# Patient Record
Sex: Male | Born: 1960
Health system: Southern US, Community
[De-identification: ages and names within clinical notes are randomized; demographics above are authoritative.]

## PROBLEM LIST (undated history)

## (undated) DIAGNOSIS — M659 Synovitis and tenosynovitis, unspecified: Secondary | ICD-10-CM

## (undated) DIAGNOSIS — J302 Other seasonal allergic rhinitis: Secondary | ICD-10-CM

## (undated) DIAGNOSIS — I10 Essential (primary) hypertension: Secondary | ICD-10-CM

## (undated) DIAGNOSIS — M1990 Unspecified osteoarthritis, unspecified site: Secondary | ICD-10-CM

## (undated) DIAGNOSIS — M6588 Other synovitis and tenosynovitis, other site: Secondary | ICD-10-CM

## (undated) DIAGNOSIS — I499 Cardiac arrhythmia, unspecified: Secondary | ICD-10-CM

## (undated) DIAGNOSIS — I2510 Atherosclerotic heart disease of native coronary artery without angina pectoris: Secondary | ICD-10-CM

## (undated) DIAGNOSIS — K219 Gastro-esophageal reflux disease without esophagitis: Secondary | ICD-10-CM

## (undated) DIAGNOSIS — F329 Major depressive disorder, single episode, unspecified: Secondary | ICD-10-CM

## (undated) HISTORY — DX: Essential (primary) hypertension: I10

## (undated) HISTORY — DX: Unspecified osteoarthritis, unspecified site: M19.90

## (undated) HISTORY — PX: KNEE ARTHROSCOPY: SUR90

## (undated) HISTORY — DX: Atherosclerotic heart disease of native coronary artery without angina pectoris: I25.10

---

## 2011-04-24 ENCOUNTER — Other Ambulatory Visit: Payer: Self-pay | Admitting: Orthopedic Surgery

## 2011-04-24 NOTE — H&P (Signed)
Blake Mcmillan  DOB: [birth year]  Date of Admission:  05/12/2011  Chief Complaint:  Left Knee Pain  History of Present Illness The patient is a 50 year old male who comes in today for a preoperative History and Physical. The patient is scheduled for a left total knee arthroplasty to be performed by [name]. [name], MD at [facility] on 05/12/2011. They have been treated conservatively in the past for the above stated problem and despite conservative measures, they continue to have progressive pain and severe functional limitations and dysfunction. They have failed non-operative management including home exercise, medications, and injections. It is felt that they would benefit from undergoing total joint replacement. Risks and benefits of the procedure have been discussed with the patient and they elect to proceed with surgery. There are no active contraindications to surgery such as ongoing infection or rapidly progressive neurological disease.  Allergies No Known Drug Allergies.   Medication History Aspirin EC (81MG  Tablet DR, Oral daily) Active. Protonix (40MG  Tablet DR, Oral daily) Active. Mltiviatamin Active. Mobic (7.5MG  Tablet, Oral daily) Active.  Medical History Osteoarthritis Gastroesophageal Reflux Disease Depression Past History of Pneumonia Past History of Transient Bradycardia. Episode was approx 9 years ago (lasted for about 3 months)  Past Surgical History Vasectomy Carpal Tunnel Repair. bilateral Arthroscopy of Knee. right Anal Fissure Repair Lateral Sphincterotomy Bilateral Sinus Surgery  Family History Cancer. father Chronic Obstructive Lung Disease. mother and brother Diabetes Mellitus. sister Drug / Alcohol Addiction. father and grandfather fathers side Heart Disease. father, brother and grandfather mothers side Heart disease in male family member before age 93 Hypertension. sister  Social  History Alcohol use. former drinker Children. 2 Current work status. working full time Marital status. married Living situation. live with spouse Post-Surgical Plans. Plan to go home following surgery  Review of Systems General:Not Present- Chills, Fever, Night Sweats, Fatigue, Weight Gain, Weight Loss and Memory Loss. Skin:Not Present- Hives, Itching, Rash, Eczema and Lesions. HEENT:Not Present- Tinnitus, Headache, Double Vision, Visual Loss, Hearing Loss and Dentures. Respiratory:Not Present- Shortness of breath with exertion, Shortness of breath at rest, Allergies, Coughing up blood and Chronic Cough. Cardiovascular:Not Present- Chest Pain, Racing/skipping heartbeats, Difficulty Breathing Lying Down, Murmur, Swelling and Palpitations. Gastrointestinal:Not Present- Bloody Stool, Heartburn, Abdominal Pain, Vomiting, Nausea, Constipation, Diarrhea, Difficulty Swallowing, Jaundice and Loss of appetitie. Male Genitourinary:Not Present- Urinary frequency, Blood in Urine, Weak urinary stream, Discharge, Flank Pain, Incontinence, Painful Urination, Urgency, Urinary Retention and Urinating at Night. Musculoskeletal:Not Present- Muscle Weakness, Muscle Pain, Joint Swelling, Joint Pain, Back Pain, Morning Stiffness and Spasms. Neurological:Not Present- Tremor, Dizziness, Blackout spells, Paralysis, Difficulty with balance and Weakness. Psychiatric:Not Present- Insomnia.  Vitals 04/24/2011 4:05 PM Weight: 275 lb Height: 70 in Body Surface Area: 2.48 m Body Mass Index: 39.46 kg/m Pulse: 64 (Regular) Resp.: 14 (Unlabored) BP: 142/88 (Sitting, Right Arm, Standard)  Physical Exam The physical exam findings are as follows:  Note: Patient is a 50 year old male seen for continued left knee pain. He is accompanied by his wife druing the visit (wife is an Char[employer] fundraiser).  General Mental Status - Alert, cooperative and good historian. General Appearance- pleasant. Not in  acute distress. Orientation- Oriented X3. Build & Nutrition- Muscular, Overweight, Well nourished and Well developed.  Head and Neck Head- normocephalic, atraumatic . Neck Global Assessment- supple. no bruit auscultated on the right and no bruit auscultated on the left.  Eye Pupil- Bilateral- Regular and Round. Motion- Bilateral- EOMI.  Chest and Lung Exam Inspection:Shape- Barrel-shaped. Auscultation:  Breath sounds:- Normal, - clear at anterior chest wall and - clear at posterior chest wall. Adventitious sounds:- No Adventitious sounds.  Cardiovascular Auscultation:Rhythm- Regular rate and rhythm. Heart Sounds- S1 WNL and S2 WNL. Murmurs & Other Heart Sounds:Auscultation of the heart reveals - No Murmurs.  Abdomen Palpation/Percussion:Tenderness- Abdomen is non-tender to palpation. Rigidity (guarding)- Abdomen is soft. Note: slighty round and protuberant Auscultation:Auscultation of the abdomen reveals - Bowel sounds normal.  Male Genitourinary Note: Not done, not pertinent to present illness  Musculoskeletal Note: On examination, well-developed male, alert and oriented, in no apparent distress. Examination of his hips show normal range of motion, no discomfort. Examination of the left knee shows valgus deformity of about five degrees. He has range of motion five to 130 degrees. There is marked crepitus on range of motion. He is tender lateral greater than medial. There is no instability noted. Examination of the right knee, no effusion. Range of motion is zero to 125 degrees. Slight crepitus on range of motion with tenderness medial greater than lateral. There is no instability noted. Pulses, sensation and motor are intact in both lower extremities.  RADIOGRAPHS: AP bilateral knees and lateral show that the patient has advanced bone-on-bone change in the lateral and patellofemoral compartment of the left knee. The right knee shows some slight narrowing  but nowhere near bone-on-bone.  Assessment & Plan Osteoarthritis Left Knee  Plan is for a Left Total Knee Replacement by Dr. Ollen Gross. Plan is to go home following the hospital stay.  [name], PA-C

## 2011-05-08 ENCOUNTER — Encounter (HOSPITAL_COMMUNITY): Payer: Self-pay | Admitting: Pharmacy Technician

## 2011-05-09 ENCOUNTER — Encounter (HOSPITAL_COMMUNITY): Payer: Self-pay

## 2011-05-09 ENCOUNTER — Encounter (HOSPITAL_COMMUNITY)
Admission: RE | Admit: 2011-05-09 | Discharge: 2011-05-09 | Disposition: A | Discharge status: Home / Self Care | Payer: Managed Care, Other (non HMO) | Source: Ambulatory Visit | Attending: Orthopedic Surgery | Admitting: Orthopedic Surgery

## 2011-05-09 DIAGNOSIS — J302 Other seasonal allergic rhinitis: Secondary | ICD-10-CM

## 2011-05-09 DIAGNOSIS — F32A Depression, unspecified: Secondary | ICD-10-CM

## 2011-05-09 DIAGNOSIS — K219 Gastro-esophageal reflux disease without esophagitis: Secondary | ICD-10-CM

## 2011-05-09 HISTORY — DX: Gastro-esophageal reflux disease without esophagitis: K21.9

## 2011-05-09 HISTORY — PX: NASAL SINUS SURGERY: SHX719

## 2011-05-09 HISTORY — DX: Other seasonal allergic rhinitis: J30.2

## 2011-05-09 HISTORY — PX: SPHINCTEROTOMY: SHX5279

## 2011-05-09 HISTORY — DX: Depression, unspecified: F32.A

## 2011-05-09 HISTORY — DX: Cardiac arrhythmia, unspecified: I49.9

## 2011-05-09 HISTORY — PX: VASECTOMY: SHX75

## 2011-05-09 HISTORY — PX: HEMORRHOID SURGERY: SHX153

## 2011-05-09 HISTORY — DX: Unspecified osteoarthritis, unspecified site: M19.90

## 2011-05-09 HISTORY — PX: CARPAL TUNNEL RELEASE: SHX101

## 2011-05-09 HISTORY — DX: Major depressive disorder, single episode, unspecified: F32.9

## 2011-05-09 LAB — SURGICAL PCR SCREEN
MRSA, PCR: NEGATIVE
Staphylococcus aureus: NEGATIVE

## 2011-05-09 LAB — URINALYSIS, ROUTINE W REFLEX MICROSCOPIC
Glucose, UA: NEGATIVE mg/dL
Leukocytes, UA: NEGATIVE
Protein, ur: NEGATIVE mg/dL
Specific Gravity, Urine: 1.025 (ref 1.005–1.030)
pH: 5.5 (ref 5.0–8.0)

## 2011-05-09 LAB — CBC
MCH: 31.1 pg (ref 26.0–34.0)
MCHC: 35.4 g/dL (ref 30.0–36.0)
Platelets: 282 10*3/uL (ref 150–400)
RDW: 13.1 % (ref 11.5–15.5)

## 2011-05-09 LAB — ABO/RH: ABO/RH(D): A NEG

## 2011-05-09 LAB — COMPREHENSIVE METABOLIC PANEL
ALT: 104 U/L — ABNORMAL HIGH (ref 0–53)
AST: 78 U/L — ABNORMAL HIGH (ref 0–37)
Albumin: 3.8 g/dL (ref 3.5–5.2)
Calcium: 9.7 mg/dL (ref 8.4–10.5)
GFR calc Af Amer: >90 mL/min (ref >90)
Sodium: 138 mEq/L (ref 135–145)
Total Protein: 8.6 g/dL — ABNORMAL HIGH (ref 6.0–8.3)

## 2011-05-09 LAB — APTT: aPTT: 37 seconds (ref 24–37)

## 2011-05-09 MED ORDER — CHLORHEXIDINE GLUCONATE 4 % EX LIQD: 60.0000 mL | Freq: Once | CUTANEOUS | Status: DC

## 2011-05-09 NOTE — Patient Instructions (Signed)
20 Blake Mcmillan  05/09/2011   Your procedure is scheduled on: 05-12-11  Report to [facility] at  0900 AM.  Call this number if you have problems the morning of surgery: [phone]   Remember:   Do not eat food:After Midnight.  May have clear liquids:until Midnight .  Clear liquids include soda, tea, black coffee, apple or grape juice, broth.  Take these medicines the morning of surgery with A SIP OF WATER: Protonix    Do not wear jewelry, make-up or nail polish.  Do not wear lotions, powders, or perfumes. You may wear deodorant.  Do not shave 48 hours prior to surgery.  Do not bring valuables to the hospital.  Contacts, dentures or bridgework may not be worn into surgery.  Leave suitcase in the car. After surgery it may be brought to your room.  For patients admitted to the hospital, checkout time is 11:00 AM the day of discharge.   Patients discharged the day of surgery will not be allowed to drive home.  Name and phone number of your driver: Thomas Hoff, spouse  Special Instructions: CHG Shower Use Special Wash: 1/2 bottle night before surgery and 1/2 bottle morning of surgery.   Please read over the following fact sheets that you were given: Blood Transfusion Information and MRSA Information, Incentive Spirometry instructions

## 2011-05-12 ENCOUNTER — Encounter (HOSPITAL_COMMUNITY): Payer: Self-pay | Admitting: *Deleted

## 2011-05-12 ENCOUNTER — Encounter (HOSPITAL_COMMUNITY): Payer: Self-pay | Admitting: Anesthesiology

## 2011-05-12 ENCOUNTER — Inpatient Hospital Stay (HOSPITAL_COMMUNITY)
Admission: RE | Admit: 2011-05-12 | Discharge: 2011-05-15 | DRG: 470 | Disposition: A | Discharge status: Home Health | Payer: Managed Care, Other (non HMO) | Source: Ambulatory Visit | Attending: Orthopedic Surgery | Admitting: Orthopedic Surgery

## 2011-05-12 ENCOUNTER — Inpatient Hospital Stay (HOSPITAL_COMMUNITY): Payer: Managed Care, Other (non HMO) | Admitting: Anesthesiology

## 2011-05-12 ENCOUNTER — Encounter (HOSPITAL_COMMUNITY)
Admission: RE | Disposition: A | Discharge status: Home Health | Payer: Self-pay | Source: Ambulatory Visit | Attending: Orthopedic Surgery

## 2011-05-12 ENCOUNTER — Other Ambulatory Visit: Payer: Self-pay

## 2011-05-12 ENCOUNTER — Encounter (HOSPITAL_COMMUNITY): Payer: Self-pay | Admitting: Orthopedic Surgery

## 2011-05-12 DIAGNOSIS — Z01812 Encounter for preprocedural laboratory examination: Secondary | ICD-10-CM

## 2011-05-12 DIAGNOSIS — E871 Hypo-osmolality and hyponatremia: Secondary | ICD-10-CM | POA: Diagnosis not present

## 2011-05-12 DIAGNOSIS — K219 Gastro-esophageal reflux disease without esophagitis: Secondary | ICD-10-CM | POA: Diagnosis present

## 2011-05-12 DIAGNOSIS — M1710 Unilateral primary osteoarthritis, unspecified knee: Principal | ICD-10-CM | POA: Diagnosis present

## 2011-05-12 DIAGNOSIS — Z96659 Presence of unspecified artificial knee joint: Secondary | ICD-10-CM

## 2011-05-12 DIAGNOSIS — Z8614 Personal history of Methicillin resistant Staphylococcus aureus infection: Secondary | ICD-10-CM

## 2011-05-12 HISTORY — PX: TOTAL KNEE ARTHROPLASTY: SHX125

## 2011-05-12 LAB — TYPE AND SCREEN: ABO/RH(D): A NEG

## 2011-05-12 SURGERY — ARTHROPLASTY, KNEE, TOTAL
Anesthesia: General | Site: Knee | Laterality: Left | Wound class: Clean

## 2011-05-12 MED ORDER — MENTHOL 3 MG MT LOZG: 1.0000 | LOZENGE | OROMUCOSAL | Status: DC | PRN

## 2011-05-12 MED ORDER — ACETAMINOPHEN 325 MG PO TABS: 650.0000 mg | ORAL_TABLET | Freq: Four times a day (QID) | ORAL | Status: DC | PRN

## 2011-05-12 MED ORDER — FENTANYL CITRATE 0.05 MG/ML IJ SOLN
INTRAMUSCULAR | Status: DC | PRN
  Administered 2011-05-12 (×4): 50 ug via INTRAVENOUS

## 2011-05-12 MED ORDER — ONDANSETRON HCL 4 MG PO TABS: 4.0000 mg | ORAL_TABLET | Freq: Four times a day (QID) | ORAL | Status: DC | PRN

## 2011-05-12 MED ORDER — CEFAZOLIN SODIUM-DEXTROSE 2-3 GM-% IV SOLR
2.0000 g | Freq: Once | INTRAVENOUS | Status: AC
  Administered 2011-05-12: 2 g via INTRAVENOUS

## 2011-05-12 MED ORDER — LACTATED RINGERS IV SOLN
INTRAVENOUS | Status: DC | PRN
  Administered 2011-05-12 (×2): via INTRAVENOUS

## 2011-05-12 MED ORDER — NEOSTIGMINE METHYLSULFATE 1 MG/ML IJ SOLN
INTRAMUSCULAR | Status: DC | PRN
  Administered 2011-05-12: 3 mg via INTRAVENOUS

## 2011-05-12 MED ORDER — PROMETHAZINE HCL 25 MG/ML IJ SOLN: 6.2500 mg | INTRAMUSCULAR | Status: DC | PRN

## 2011-05-12 MED ORDER — ACETAMINOPHEN 10 MG/ML IV SOLN
INTRAVENOUS | Status: DC | PRN
  Administered 2011-05-12: 1000 mg via INTRAVENOUS

## 2011-05-12 MED ORDER — HYDROMORPHONE HCL PF 1 MG/ML IJ SOLN
0.2500 mg | INTRAMUSCULAR | Status: DC | PRN
Start: 2011-05-12 — End: 2011-05-12
  Administered 2011-05-12 (×2): 0.5 mg via INTRAVENOUS

## 2011-05-12 MED ORDER — TEMAZEPAM 15 MG PO CAPS: 15.0000 mg | ORAL_CAPSULE | Freq: Every evening | ORAL | Status: DC | PRN

## 2011-05-12 MED ORDER — SALINE SPRAY 0.65 % NA SOLN
2.0000 | Freq: Two times a day (BID) | NASAL | Status: DC
  Administered 2011-05-12 – 2011-05-14 (×5): 2 via NASAL
  Filled 2011-05-12: qty 44

## 2011-05-12 MED ORDER — ROCURONIUM BROMIDE 100 MG/10ML IV SOLN
INTRAVENOUS | Status: DC | PRN
  Administered 2011-05-12: 20 mg via INTRAVENOUS
  Administered 2011-05-12: 5 mg via INTRAVENOUS

## 2011-05-12 MED ORDER — SUCCINYLCHOLINE CHLORIDE 20 MG/ML IJ SOLN
INTRAMUSCULAR | Status: DC | PRN
  Administered 2011-05-12: 100 mg via INTRAVENOUS

## 2011-05-12 MED ORDER — DIPHENHYDRAMINE HCL 12.5 MG/5ML PO ELIX
12.5000 mg | ORAL_SOLUTION | Freq: Four times a day (QID) | ORAL | Status: DC | PRN
  Filled 2011-05-12: qty 5

## 2011-05-12 MED ORDER — GLYCOPYRROLATE 0.2 MG/ML IJ SOLN
INTRAMUSCULAR | Status: DC | PRN
  Administered 2011-05-12: .4 mg via INTRAVENOUS

## 2011-05-12 MED ORDER — MORPHINE SULFATE (PF) 1 MG/ML IV SOLN
INTRAVENOUS | Status: DC
  Administered 2011-05-12: 5 mg via INTRAVENOUS
  Administered 2011-05-12: 14 mg via INTRAVENOUS
  Administered 2011-05-12: 1 mg via INTRAVENOUS
  Administered 2011-05-12: 8 mg via INTRAVENOUS
  Administered 2011-05-12: 14:00:00 via INTRAVENOUS
  Administered 2011-05-13: 5 mg via INTRAVENOUS
  Administered 2011-05-13: 6 mg via INTRAVENOUS
  Filled 2011-05-12 (×2): qty 25

## 2011-05-12 MED ORDER — PROPOFOL 10 MG/ML IV BOLUS
INTRAVENOUS | Status: DC | PRN
  Administered 2011-05-12: 180 mg via INTRAVENOUS

## 2011-05-12 MED ORDER — PHENOL 1.4 % MT LIQD: 1.0000 | OROMUCOSAL | Status: DC | PRN

## 2011-05-12 MED ORDER — METHOCARBAMOL 500 MG PO TABS
500.0000 mg | ORAL_TABLET | Freq: Four times a day (QID) | ORAL | Status: DC | PRN
  Administered 2011-05-13 – 2011-05-14 (×5): 500 mg via ORAL
  Filled 2011-05-12 (×5): qty 1

## 2011-05-12 MED ORDER — DIPHENHYDRAMINE HCL 12.5 MG/5ML PO ELIX
12.5000 mg | ORAL_SOLUTION | ORAL | Status: DC | PRN
  Filled 2011-05-12: qty 10

## 2011-05-12 MED ORDER — ONDANSETRON HCL 4 MG/2ML IJ SOLN
4.0000 mg | Freq: Four times a day (QID) | INTRAMUSCULAR | Status: DC | PRN
  Administered 2011-05-12 – 2011-05-13 (×2): 4 mg via INTRAVENOUS

## 2011-05-12 MED ORDER — LIDOCAINE HCL (CARDIAC) 20 MG/ML IV SOLN
INTRAVENOUS | Status: DC | PRN
  Administered 2011-05-12: 100 mg via INTRAVENOUS

## 2011-05-12 MED ORDER — DIPHENHYDRAMINE HCL 50 MG/ML IJ SOLN: 12.5000 mg | Freq: Four times a day (QID) | INTRAMUSCULAR | Status: DC | PRN

## 2011-05-12 MED ORDER — SODIUM CHLORIDE 0.9 % IR SOLN
Status: DC | PRN
  Administered 2011-05-12: 1000 mL

## 2011-05-12 MED ORDER — SODIUM CHLORIDE 0.9 % IJ SOLN: 9.0000 mL | INTRAMUSCULAR | Status: DC | PRN

## 2011-05-12 MED ORDER — MIDAZOLAM HCL 5 MG/5ML IJ SOLN
INTRAMUSCULAR | Status: DC | PRN
  Administered 2011-05-12: 2 mg via INTRAVENOUS

## 2011-05-12 MED ORDER — NALOXONE HCL 0.4 MG/ML IJ SOLN: 0.4000 mg | INTRAMUSCULAR | Status: DC | PRN

## 2011-05-12 MED ORDER — BUPIVACAINE 0.25 % ON-Q PUMP SINGLE CATH 300ML
300.0000 mL | INJECTION | Status: DC
  Administered 2011-05-12: 300 mL
  Filled 2011-05-12: qty 300

## 2011-05-12 MED ORDER — ONDANSETRON HCL 4 MG/2ML IJ SOLN
INTRAMUSCULAR | Status: DC | PRN
  Administered 2011-05-12: 4 mg via INTRAVENOUS

## 2011-05-12 MED ORDER — ONDANSETRON HCL 4 MG/2ML IJ SOLN
4.0000 mg | Freq: Four times a day (QID) | INTRAMUSCULAR | Status: DC | PRN
  Filled 2011-05-12 (×2): qty 2

## 2011-05-12 MED ORDER — METOCLOPRAMIDE HCL 5 MG/ML IJ SOLN
5.0000 mg | Freq: Three times a day (TID) | INTRAMUSCULAR | Status: DC | PRN
  Administered 2011-05-13: 10 mg via INTRAVENOUS
  Filled 2011-05-12: qty 2

## 2011-05-12 MED ORDER — RIVAROXABAN 10 MG PO TABS
10.0000 mg | ORAL_TABLET | Freq: Every day | ORAL | Status: DC
  Administered 2011-05-13 – 2011-05-15 (×3): 10 mg via ORAL
  Filled 2011-05-12 (×3): qty 1

## 2011-05-12 MED ORDER — LABETALOL HCL 5 MG/ML IV SOLN
INTRAVENOUS | Status: DC | PRN
  Administered 2011-05-12 (×2): 5 mg via INTRAVENOUS

## 2011-05-12 MED ORDER — POTASSIUM CHLORIDE IN NACL 20-0.9 MEQ/L-% IV SOLN
INTRAVENOUS | Status: DC
  Administered 2011-05-12 – 2011-05-13 (×2): via INTRAVENOUS
  Filled 2011-05-12 (×4): qty 1000

## 2011-05-12 MED ORDER — DEXTROSE 5 % IV SOLN
500.0000 mg | Freq: Four times a day (QID) | INTRAVENOUS | Status: DC | PRN
  Administered 2011-05-12: 500 mg via INTRAVENOUS
  Filled 2011-05-12: qty 5

## 2011-05-12 MED ORDER — ACETAMINOPHEN 650 MG RE SUPP: 650.0000 mg | Freq: Four times a day (QID) | RECTAL | Status: DC | PRN

## 2011-05-12 MED ORDER — BUPIVACAINE ON-Q PAIN PUMP (FOR ORDER SET NO CHG)
INJECTION | Status: DC
  Filled 2011-05-12: qty 1

## 2011-05-12 MED ORDER — CEFAZOLIN SODIUM 1-5 GM-% IV SOLN
1.0000 g | Freq: Four times a day (QID) | INTRAVENOUS | Status: AC
  Administered 2011-05-12 – 2011-05-13 (×3): 1 g via INTRAVENOUS
  Filled 2011-05-12 (×3): qty 50

## 2011-05-12 MED ORDER — OXYCODONE HCL 5 MG PO TABS
5.0000 mg | ORAL_TABLET | ORAL | Status: DC | PRN
  Administered 2011-05-13: 10 mg via ORAL
  Administered 2011-05-13: 5 mg via ORAL
  Administered 2011-05-13 – 2011-05-15 (×7): 10 mg via ORAL
  Filled 2011-05-12 (×8): qty 2
  Filled 2011-05-12: qty 1

## 2011-05-12 MED ORDER — DOCUSATE SODIUM 100 MG PO CAPS
100.0000 mg | ORAL_CAPSULE | Freq: Two times a day (BID) | ORAL | Status: DC
  Administered 2011-05-12 – 2011-05-15 (×6): 100 mg via ORAL
  Filled 2011-05-12 (×7): qty 1

## 2011-05-12 MED ORDER — METOCLOPRAMIDE HCL 10 MG PO TABS: 5.0000 mg | ORAL_TABLET | Freq: Three times a day (TID) | ORAL | Status: DC | PRN

## 2011-05-12 MED ORDER — SALINE NASAL SPRAY 0.65 % NA SOLN
2.0000 | Freq: Two times a day (BID) | NASAL | Status: DC
  Filled 2011-05-12 (×7): qty 0.9

## 2011-05-12 MED ORDER — PANTOPRAZOLE SODIUM 40 MG PO TBEC
40.0000 mg | DELAYED_RELEASE_TABLET | Freq: Every day | ORAL | Status: DC
  Administered 2011-05-12 – 2011-05-14 (×3): 40 mg via ORAL
  Filled 2011-05-12 (×4): qty 1

## 2011-05-12 MED ORDER — HYDROMORPHONE HCL PF 1 MG/ML IJ SOLN
INTRAMUSCULAR | Status: DC | PRN
  Administered 2011-05-12 (×2): 1 mg via INTRAVENOUS

## 2011-05-12 MED ORDER — LACTATED RINGERS IV SOLN
INTRAVENOUS | Status: DC
  Administered 2011-05-12: 14:00:00 via INTRAVENOUS

## 2011-05-12 SURGICAL SUPPLY — 53 items
BAG ZIPLOCK 12X15 (MISCELLANEOUS) ×2 IMPLANT
BANDAGE ELASTIC 6 VELCRO ST LF (GAUZE/BANDAGES/DRESSINGS) ×2 IMPLANT
BANDAGE ESMARK 6X9 LF (GAUZE/BANDAGES/DRESSINGS) ×1 IMPLANT
BLADE SAG 18X100X1.27 (BLADE) ×2 IMPLANT
BLADE SAW SGTL 11.0X1.19X90.0M (BLADE) ×2 IMPLANT
BNDG ESMARK 6X9 LF (GAUZE/BANDAGES/DRESSINGS) ×2
BOWL SMART MIX CTS (DISPOSABLE) ×2 IMPLANT
CATH KIT ON-Q SILVERSOAK 5IN (CATHETERS) ×2 IMPLANT
CEMENT HV SMART SET (Cement) ×2 IMPLANT
CLOTH BEACON ORANGE TIMEOUT ST (SAFETY) ×2 IMPLANT
CUFF TOURN SGL QUICK 34 (TOURNIQUET CUFF) ×1
CUFF TRNQT CYL 34X4X40X1 (TOURNIQUET CUFF) ×1 IMPLANT
DRAPE EXTREMITY T 121X128X90 (DRAPE) ×2 IMPLANT
DRAPE POUCH INSTRU U-SHP 10X18 (DRAPES) ×2 IMPLANT
DRAPE U-SHAPE 47X51 STRL (DRAPES) ×2 IMPLANT
DRSG ADAPTIC 3X8 NADH LF (GAUZE/BANDAGES/DRESSINGS) ×2 IMPLANT
DRSG PAD ABDOMINAL 8X10 ST (GAUZE/BANDAGES/DRESSINGS) ×2 IMPLANT
DURAPREP 26ML APPLICATOR (WOUND CARE) ×2 IMPLANT
ELECT REM PT RETURN 9FT ADLT (ELECTROSURGICAL) ×2
ELECTRODE REM PT RTRN 9FT ADLT (ELECTROSURGICAL) ×1 IMPLANT
EVACUATOR 1/8 PVC DRAIN (DRAIN) ×2 IMPLANT
FACESHIELD LNG OPTICON STERILE (SAFETY) ×14 IMPLANT
GAUZE SPONGE 4X4 12PLY STRL LF (GAUZE/BANDAGES/DRESSINGS) ×2 IMPLANT
GLOVE BIO SURGEON STRL SZ7.5 (GLOVE) ×2 IMPLANT
GLOVE BIO SURGEON STRL SZ8 (GLOVE) ×2 IMPLANT
GLOVE BIOGEL PI IND STRL 8 (GLOVE) ×2 IMPLANT
GLOVE BIOGEL PI INDICATOR 8 (GLOVE) ×2
GOWN STRL NON-REIN LRG LVL3 (GOWN DISPOSABLE) ×2 IMPLANT
GOWN STRL REIN XL XLG (GOWN DISPOSABLE) ×2 IMPLANT
HANDPIECE INTERPULSE COAX TIP (DISPOSABLE) ×1
IMMOBILIZER KNEE 20 (SOFTGOODS) ×2
IMMOBILIZER KNEE 20 THIGH 36 (SOFTGOODS) ×1 IMPLANT
IMMOBILIZER KNEE 22 UNIV (SOFTGOODS) ×2 IMPLANT
KIT BASIN OR (CUSTOM PROCEDURE TRAY) ×2 IMPLANT
MANIFOLD NEPTUNE II (INSTRUMENTS) ×2 IMPLANT
NS IRRIG 1000ML POUR BTL (IV SOLUTION) ×2 IMPLANT
PACK TOTAL JOINT (CUSTOM PROCEDURE TRAY) ×2 IMPLANT
PAD ABD 7.5X8 STRL (GAUZE/BANDAGES/DRESSINGS) ×2 IMPLANT
PADDING CAST COTTON 6X4 STRL (CAST SUPPLIES) ×6 IMPLANT
PADDING WEBRIL 6 STERILE (GAUZE/BANDAGES/DRESSINGS) ×2 IMPLANT
POSITIONER SURGICAL ARM (MISCELLANEOUS) ×2 IMPLANT
SET HNDPC FAN SPRY TIP SCT (DISPOSABLE) ×1 IMPLANT
SPONGE GAUZE 4X4 12PLY (GAUZE/BANDAGES/DRESSINGS) ×2 IMPLANT
STRIP CLOSURE SKIN 1/2X4 (GAUZE/BANDAGES/DRESSINGS) ×4 IMPLANT
SUCTION FRAZIER 12FR DISP (SUCTIONS) ×2 IMPLANT
SUT MNCRL AB 4-0 PS2 18 (SUTURE) ×2 IMPLANT
SUT PDS AB 1 CT1 27 (SUTURE) ×6 IMPLANT
SUT VIC AB 2-0 CT1 27 (SUTURE) ×3
SUT VIC AB 2-0 CT1 TAPERPNT 27 (SUTURE) ×3 IMPLANT
TOWEL OR 17X26 10 PK STRL BLUE (TOWEL DISPOSABLE) ×4 IMPLANT
TRAY FOLEY CATH 14FRSI W/METER (CATHETERS) ×2 IMPLANT
WATER STERILE IRR 1500ML POUR (IV SOLUTION) ×2 IMPLANT
WRAP KNEE MAXI GEL POST OP (GAUZE/BANDAGES/DRESSINGS) ×4 IMPLANT

## 2011-05-12 NOTE — Transfer of Care (Signed)
Immediate Anesthesia Transfer of Care Note  Patient: Blake Mcmillan  Procedure(s) Performed:  TOTAL KNEE ARTHROPLASTY  Patient Location: PACU  Anesthesia Type: General  Level of Consciousness: awake and alert   Airway & Oxygen Therapy: Patient Spontanous Breathing and Patient connected to face mask oxygen  Post-op Assessment: Report given to PACU RN and Post -op Vital signs reviewed and stable  Post vital signs: Reviewed and stable  Complications: No apparent anesthesia complications

## 2011-05-12 NOTE — Interval H&P Note (Signed)
History and Physical Interval Note:  05/12/2011 11:22 AM  Blake Mcmillan  has presented today for surgery, with the diagnosis of osteoarthritis left knee  The various methods of treatment have been discussed with the patient and family. After consideration of risks, benefits and other options for treatment, the patient has consented to  Procedure(s): TOTAL KNEE ARTHROPLASTY as a surgical intervention .  The patients' history has been reviewed, patient examined, no change in status, stable for surgery.  I have reviewed the patients' chart and labs.  Questions were answered to the patient's satisfaction.     Loanne Drilling

## 2011-05-12 NOTE — Anesthesia Preprocedure Evaluation (Signed)
Anesthesia Evaluation  Patient identified by MRN, date of birth, ID band Patient awake    Reviewed: Allergy & Precautions, H&P , NPO status , Patient's Chart, lab work & pertinent test results, reviewed documented beta blocker date and time   Airway Mallampati: II TM Distance: >3 FB Neck ROM: Full    Dental   Pulmonary neg pulmonary ROS,  clear to auscultation        Cardiovascular neg cardio ROS Regular Normal Denies cardiac symptoms   Neuro/Psych Negative Neurological ROS  Negative Psych ROS   GI/Hepatic negative GI ROS, Neg liver ROS,   Endo/Other  Negative Endocrine ROS  Renal/GU negative Renal ROS  Genitourinary negative   Musculoskeletal negative musculoskeletal ROS (+)   Abdominal   Peds negative pediatric ROS (+)  Hematology negative hematology ROS (+)   Anesthesia Other Findings   Reproductive/Obstetrics negative OB ROS                           Anesthesia Physical Anesthesia Plan  ASA: III  Anesthesia Plan: General   Post-op Pain Management:    Induction: Intravenous  Airway Management Planned: Oral ETT  Additional Equipment:   Intra-op Plan:   Post-operative Plan: Extubation in OR  Informed Consent: I have reviewed the patients History and Physical, chart, labs and discussed the procedure including the risks, benefits and alternatives for the proposed anesthesia with the patient or authorized representative who has indicated his/her understanding and acceptance.     Plan Discussed with: CRNA and Surgeon  Anesthesia Plan Comments:         Anesthesia Quick Evaluation

## 2011-05-12 NOTE — Anesthesia Postprocedure Evaluation (Signed)
  Anesthesia Post-op Note  Patient: Blake Mcmillan  Procedure(s) Performed:  TOTAL KNEE ARTHROPLASTY  Patient Location: PACU  Anesthesia Type: General  Level of Consciousness: oriented and sedated  Airway and Oxygen Therapy: Patient Spontanous Breathing and Patient connected to nasal cannula oxygen  Post-op Pain: mild  Post-op Assessment: Post-op Vital signs reviewed, Patient's Cardiovascular Status Stable, Respiratory Function Stable and Patent Airway  Post-op Vital Signs: stable  Complications: No apparent anesthesia complications

## 2011-05-12 NOTE — H&P (View-Only) (Signed)
Blake Mcmillan  DOB: 04/04/1961  Date of Admission:  05/12/2011  Chief Complaint:  Left Knee Pain  History of Present Illness The patient is a 50 year old male who comes in today for a preoperative History and Physical. The patient is scheduled for a left total knee arthroplasty to be performed by Dr. Frank V. [name], MD at [facility] on 05/12/2011. They have been treated conservatively in the past for the above stated problem and despite conservative measures, they continue to have progressive pain and severe functional limitations and dysfunction. They have failed non-operative management including home exercise, medications, and injections. It is felt that they would benefit from undergoing total joint replacement. Risks and benefits of the procedure have been discussed with the patient and they elect to proceed with surgery. There are no active contraindications to surgery such as ongoing infection or rapidly progressive neurological disease.  Allergies No Known Drug Allergies.   Medication History Aspirin EC (81MG Tablet DR, Oral daily) Active. Protonix (40MG Tablet DR, Oral daily) Active. Mltiviatamin Active. Mobic (7.5MG Tablet, Oral daily) Active.  Medical History Osteoarthritis Gastroesophageal Reflux Disease Depression Past History of Pneumonia Past History of Transient Bradycardia. Episode was approx 9 years ago (lasted for about 3 months)  Past Surgical History Vasectomy Carpal Tunnel Repair. bilateral Arthroscopy of Knee. right Anal Fissure Repair Lateral Sphincterotomy Bilateral Sinus Surgery  Family History Cancer. father Chronic Obstructive Lung Disease. mother and brother Diabetes Mellitus. sister Drug / Alcohol Addiction. father and grandfather fathers side Heart Disease. father, brother and grandfather mothers side Heart disease in male family member before age 55 Hypertension. sister  Social  History Alcohol use. former drinker Children. 2 Current work status. working full time Marital status. married Living situation. live with spouse Post-Surgical Plans. Plan to go home following surgery  Review of Systems General:Not Present- Chills, Fever, Night Sweats, Fatigue, Weight Gain, Weight Loss and Memory Loss. Skin:Not Present- Hives, Itching, Rash, Eczema and Lesions. HEENT:Not Present- Tinnitus, Headache, Double Vision, Visual Loss, Hearing Loss and Dentures. Respiratory:Not Present- Shortness of breath with exertion, Shortness of breath at rest, Allergies, Coughing up blood and Chronic Cough. Cardiovascular:Not Present- Chest Pain, Racing/skipping heartbeats, Difficulty Breathing Lying Down, Murmur, Swelling and Palpitations. Gastrointestinal:Not Present- Bloody Stool, Heartburn, Abdominal Pain, Vomiting, Nausea, Constipation, Diarrhea, Difficulty Swallowing, Jaundice and Loss of appetitie. Male Genitourinary:Not Present- Urinary frequency, Blood in Urine, Weak urinary stream, Discharge, Flank Pain, Incontinence, Painful Urination, Urgency, Urinary Retention and Urinating at Night. Musculoskeletal:Not Present- Muscle Weakness, Muscle Pain, Joint Swelling, Joint Pain, Back Pain, Morning Stiffness and Spasms. Neurological:Not Present- Tremor, Dizziness, Blackout spells, Paralysis, Difficulty with balance and Weakness. Psychiatric:Not Present- Insomnia.  Vitals 04/24/2011 4:05 PM Weight: 275 lb Height: 70 in Body Surface Area: 2.48 m Body Mass Index: 39.46 kg/m Pulse: 64 (Regular) Resp.: 14 (Unlabored) BP: 142/88 (Sitting, Right Arm, Standard)  Physical Exam The physical exam findings are as follows:  Note: Patient is a 50 year old male seen for continued left knee pain. He is accompanied by his wife druing the visit (wife is an RN).  General Mental Status - Alert, cooperative and good historian. General Appearance- pleasant. Not in  acute distress. Orientation- Oriented X3. Build & Nutrition- Muscular, Overweight, Well nourished and Well developed.  Head and Neck Head- normocephalic, atraumatic . Neck Global Assessment- supple. no bruit auscultated on the right and no bruit auscultated on the left.  Eye Pupil- Bilateral- Regular and Round. Motion- Bilateral- EOMI.  Chest and Lung Exam Inspection:Shape- Barrel-shaped. Auscultation:   Breath sounds:- Normal, - clear at anterior chest wall and - clear at posterior chest wall. Adventitious sounds:- No Adventitious sounds.  Cardiovascular Auscultation:Rhythm- Regular rate and rhythm. Heart Sounds- S1 WNL and S2 WNL. Murmurs & Other Heart Sounds:Auscultation of the heart reveals - No Murmurs.  Abdomen Palpation/Percussion:Tenderness- Abdomen is non-tender to palpation. Rigidity (guarding)- Abdomen is soft. Note: slighty round and protuberant Auscultation:Auscultation of the abdomen reveals - Bowel sounds normal.  Male Genitourinary Note: Not done, not pertinent to present illness  Musculoskeletal Note: On examination, well-developed male, alert and oriented, in no apparent distress. Examination of his hips show normal range of motion, no discomfort. Examination of the left knee shows valgus deformity of about five degrees. He has range of motion five to 130 degrees. There is marked crepitus on range of motion. He is tender lateral greater than medial. There is no instability noted. Examination of the right knee, no effusion. Range of motion is zero to 125 degrees. Slight crepitus on range of motion with tenderness medial greater than lateral. There is no instability noted. Pulses, sensation and motor are intact in both lower extremities.  RADIOGRAPHS: AP bilateral knees and lateral show that the patient has advanced bone-on-bone change in the lateral and patellofemoral compartment of the left knee. The right knee shows some slight narrowing  but nowhere near bone-on-bone.  Assessment & Plan Osteoarthritis Left Knee  Plan is for a Left Total Knee Replacement by Dr. Frank [name]. Plan is to go home following the hospital stay.  Drew Rajon Bisig, PA-C   

## 2011-05-12 NOTE — Op Note (Signed)
Pre-operative diagnosis- Osteoarthritis  Left knee(s)  Post-operative diagnosis- Osteoarthritis Left knee(s)  Procedure-  Left  Total Knee Arthroplasty  Surgeon- Gus Rankin. Christianne Zacher, MD  Assistant- Dimitri Ped, PA-C   Anesthesia-  General EBL-* No blood loss amount entered *  Drains Hemovac  Tourniquet time-  Total Tourniquet Time Documented: area (laterality) - 51 minutes   Complications- None  Condition-PACU - hemodynamically stable.   Brief Clinical Note   Blake Mcmillan is a 50 y.o. year old male with end stage OA of his left knee with progressively worsening pain and dysfunction. He has constant pain, with activity and at rest and significant functional deficits with difficulties even with ADLs. He has had extensive non-op management including analgesics, injections of cortisone and viscosupplements, and home exercise program, but remains in significant pain with significant dysfunction. Radiographs show bone on bone arthritis lateral and patellofemoral. He presents now for left Total Knee Arthroplasty.    Procedure in detail---   The patient is brought into the operating room and positioned supine on the operating table. After successful administration of  General,   a tourniquet is placed high on the  Left thigh(s) and the lower extremity is prepped and draped in the usual sterile fashion. Time out is performed by the operating team and then the  Left lower extremity is wrapped in Esmarch, knee flexed and the tourniquet inflated to 300 mmHg.       A midline incision is made with a ten blade through the subcutaneous tissue to the level of the extensor mechanism. A fresh blade is used to make a medial parapatellar arthrotomy. Soft tissue over the proximal medial tibia is subperiosteally elevated to the joint line with a knife and into the semimembranosus bursa with a Cobb elevator. Soft tissue over the proximal lateral tibia is elevated with attention being paid to avoiding the  patellar tendon on the tibial tubercle. The patella is everted, knee flexed 90 degrees and the ACL and PCL are removed. Findings are bone on bone lateral with large osteophytes.        The drill is used to create a starting hole in the distal femur and the canal is thoroughly irrigated with sterile saline to remove the fatty contents. The 5 degree Left  valgus alignment guide is placed into the femoral canal and the distal femoral cutting block is pinned to remove 11 mm off the distal femur. Resection is made with an oscillating saw.      The tibia is subluxed forward and the menisci are removed. The extramedullary alignment guide is placed referencing proximally at the medial aspect of the tibial tubercle and distally along the second metatarsal axis and tibial crest. The block is pinned to remove 2mm off the more deficient lateral  side. Resection is made with an oscillating saw. Size 4is the most appropriate size for the tibia and the proximal tibia is prepared with the modular drill and keel punch for that size.      The femoral sizing guide is placed and size 4 is most appropriate. Rotation is marked off the epicondylar axis and confirmed by creating a rectangular flexion gap at 90 degrees. The size 4 cutting block is pinned in this rotation and the anterior, posterior and chamfer cuts are made with the oscillating saw. The intercondylar block is then placed and that cut is made.      Trial size 4 tibial component, trial size 4 posterior stabilized femur and a 12.5  mm posterior  stabilized rotating platform insert trial is placed. Full extension is achieved with excellent varus/valgus and anterior/posterior balance throughout full range of motion. The patella is everted and thickness measured to be 27  mm. Free hand resection is taken to 15 mm, a 41 template is placed, lug holes are drilled, trial patella is placed, and it tracks normally. Osteophytes are removed off the posterior femur with the trial in  place. All trials are removed and the cut bone surfaces prepared with pulsatile lavage. Cement is mixed and once ready for implantation, the size 4 tibial implant, size  4 posterior stabilized femoral component, and the size 41 patella are cemented in place and the patella is held with the clamp. The trial insert is placed and the knee held in full extension. All extruded cement is removed and once the cement is hard the permanent 12.5 mm posterior stabilized rotating platform insert is placed into the tibial tray.      The wound is copiously irrigated with saline solution and the extensor mechanism closed over a hemovac drain with #1 PDS suture. The tourniquet is released for a total tourniquet time of 51  minutes. Flexion against gravity is 135 degrees and the patella tracks normally. Subcutaneous tissue is closed with 2.0 vicryl and subcuticular with running 4.0 Monocryl. The catheter for the Marcaine pain pump is placed and the pump is initiated. The incision is cleaned and dried and steri-strips and a bulky sterile dressing are applied. The limb is placed into a knee immobilizer and the patient is awakened and transported to recovery in stable condition.      Please note that a surgical assistant was a medical necessity for this procedure in order to perform it in a safe and expeditious manner. Surgical assistant was necessary to retract the ligaments and vital neurovascular structures to prevent injury to them and also necessary for proper positioning of the limb to allow for anatomic placement of the prosthesis.   Gus Rankin Shown Dissinger, MD    05/12/2011, 12:58 PM

## 2011-05-13 LAB — CBC
HCT: 38.7 % — ABNORMAL LOW (ref 39.0–52.0)
MCHC: 35.4 g/dL (ref 30.0–36.0)
Platelets: 235 10*3/uL (ref 150–400)
RDW: 13 % (ref 11.5–15.5)
WBC: 19.2 10*3/uL — ABNORMAL HIGH (ref 4.0–10.5)

## 2011-05-13 LAB — BASIC METABOLIC PANEL
BUN: 9 mg/dL (ref 6–23)
Chloride: 100 mEq/L (ref 96–112)
GFR calc Af Amer: >90 mL/min (ref >90)
GFR calc non Af Amer: >90 mL/min (ref >90)
Potassium: 4.2 mEq/L (ref 3.5–5.1)
Sodium: 135 mEq/L (ref 135–145)

## 2011-05-13 MED ORDER — METOPROLOL TARTRATE 25 MG PO TABS
25.0000 mg | ORAL_TABLET | Freq: Two times a day (BID) | ORAL | Status: DC
  Administered 2011-05-13 – 2011-05-15 (×4): 25 mg via ORAL
  Filled 2011-05-13 (×5): qty 1

## 2011-05-13 MED ORDER — MORPHINE SULFATE 2 MG/ML IJ SOLN
1.0000 mg | INTRAMUSCULAR | Status: DC | PRN
  Administered 2011-05-13 – 2011-05-14 (×7): 2 mg via INTRAVENOUS
  Filled 2011-05-13 (×7): qty 1

## 2011-05-13 NOTE — Progress Notes (Signed)
Utilization review completed.  

## 2011-05-13 NOTE — Progress Notes (Signed)
Physical Therapy Evaluation Patient Details Name: Blake Mcmillan MRN: [id number] DOB: 07/29/1960 Today's Date: 05/13/2011  [phone] EVII  Problem List: There is no problem list on file for this patient.   Past Medical History:  Past Medical History  Diagnosis Date  . Dysrhythmia 05-09-11    epidoses of bradycardia, stress and other test negative-Lopressor d/c.  [name] Seasonal allergies 05-09-11    seasonal allergies with sinus issues  . GERD (gastroesophageal reflux disease) 05-09-11    tx. Pantoprazole  . Arthritis 05-09-11    osteoarthritis-all joints  . Depression 05-09-11    past hx. depression, none recent  . Liver enzyme elevation 05-09-11    past hx., not now-more normal  . Abscess of buttock, right 05-09-11    lance in ERD and oral antibiotic-4 yrs ago positive MRSA, no reoccurrence   Past Surgical History:  Past Surgical History  Procedure Date  . Sphincterotomy 05-09-11    '97  . Hemorrhoid surgery 05-09-11    '96  . Vasectomy 05-09-11    '07  . Nasal sinus surgery 05-09-11    2'12 [place]  . Carpal tunnel release 05-09-11    Bilaterally '02    PT Assessment/Plan/Recommendation PT Assessment Clinical Impression Statement: Pt tolerated ambulation well during therapy session.  Pt states that he has all required equipment at home and does not need any additional equipment.  Pt would benefit from skilled acute PT and D/C to participate in HHPT to address remaining deficits.   PT Recommendation/Assessment: Patient will need skilled PT in the acute care venue PT Problem List: Decreased strength;Decreased range of motion;Decreased mobility;Decreased knowledge of use of DME;Pain Barriers to Discharge: None PT Therapy Diagnosis : Difficulty walking;Abnormality of gait;Acute pain PT Plan PT Frequency: 7X/week PT Treatment/Interventions: DME instruction;Gait training;Stair training;Functional mobility training;Therapeutic activities;Therapeutic  exercise;Patient/family education PT Recommendation Follow Up Recommendations: Home health PT Equipment Recommended: None recommended by PT PT Goals  Acute Rehab PT Goals PT Goal Formulation: With patient Time For Goal Achievement: 5 days Pt will go Supine/Side to Sit: with supervision PT Goal: Supine/Side to Sit - Progress: Progressing toward goal Pt will go Sit to Supine/Side: with supervision PT Goal: Sit to Supine/Side - Progress: Other (comment) Pt will go Sit to Stand: with supervision PT Goal: Sit to Stand - Progress: Progressing toward goal Pt will go Stand to Sit: with supervision PT Goal: Stand to Sit - Progress: Progressing toward goal Pt will Ambulate: >150 feet;with least restrictive assistive device PT Goal: Ambulate - Progress: Progressing toward goal Pt will Go Up / Down Stairs: 1-2 stairs;with supervision;with least restrictive assistive device PT Goal: Up/Down Stairs - Progress: Other (comment) Pt will Perform Home Exercise Program: Independently PT Goal: Perform Home Exercise Program - Progress: Other (comment)  PT Evaluation Precautions/Restrictions  Precautions Required Braces or Orthoses: Yes Knee Immobilizer: Discontinue once straight leg raise with < 10 degree lag Restrictions Weight Bearing Restrictions: Yes LLE Weight Bearing: Weight bearing as tolerated Prior Functioning  Home Living Lives With: Spouse Receives Help From: Family Type of Home: House Home Layout: Multi-level Home Access: Stairs to enter Entrance Stairs-Rails: None Entrance Stairs-Number of Steps: 2 Home Adaptive Equipment: Walker - rolling;Bedside commode/3-in-1;Straight cane;Shower chair without back Prior Function Level of Independence: Independent with basic ADLs;Independent with gait;Independent with transfers Driving: Yes Vocation: Full time employment Cognition Cognition Arousal/Alertness: Awake/alert Overall Cognitive Status: Appears within functional limits for tasks  assessed Orientation Level: Oriented X4 Sensation/Coordination Sensation Light Touch: Appears Intact Proprioception: Appears Intact Coordination Gross Motor Movements  are Fluid and Coordinated: Yes Extremity Assessment RUE Assessment RUE Assessment: Within Functional Limits LUE Assessment LUE Assessment: Within Functional Limits RLE Strength RLE Overall Strength Comments: grossly 3+/5 per functional assessment LLE Strength LLE Overall Strength Comments: Not tested due to surgical incision, ankle pumps WFL, quads 2+/5 Mobility (including Balance) Bed Mobility Bed Mobility: Yes Supine to Sit: 4: Min assist;HOB elevated (Comment degrees) Supine to Sit Details (indicate cue type and reason): Min A with L LE, HOB elevated approx 40 deg Transfers Transfers: Yes Sit to Stand: 4: Min assist;From bed;From elevated surface;With upper extremity assist Sit to Stand Details (indicate cue type and reason): Pt required cues for hand placement and L LE management Stand to Sit: 4: Min assist;To chair/3-in-1;With upper extremity assist;With armrests Stand to Sit Details: Required cues for hand placement and assist with L LE Ambulation/Gait Ambulation/Gait: Yes Ambulation/Gait Assistance: 4: Min assist Ambulation/Gait Assistance Details (indicate cue type and reason): Required cues for placement and sequencing of RW Ambulation Distance (Feet): 50 Feet Assistive device: Rolling walker Gait Pattern: Step-to pattern Gait velocity: decreased gait velocity Stairs: No Wheelchair Mobility Wheelchair Mobility: No    Exercise    End of Session PT - End of Session Equipment Utilized During Treatment: Left knee immobilizer Activity Tolerance: Patient tolerated treatment well Patient left: in chair;with call bell in reach Nurse Communication: Other (comment) (RN notified pts request for pain meds, O2 sats on room air ) General Behavior During Session: [facility] for tasks performed Cognition: [facility] for  tasks performed  Page, Meribeth Mattes 05/13/2011, 10:23 AM

## 2011-05-13 NOTE — Progress Notes (Signed)
Subjective: 1 Day Post-Op Procedure(s) (LRB): TOTAL KNEE ARTHROPLASTY (Left) Patient reports pain as mild.   Patient seen in rounds with Dr. Lequita Halt. Patient doing well on day one.  Had a decent night.  Able to get some rest last night We will start therapy today. Plan is to go home after hospital stay.  Objective: Vital signs in last 24 hours: Temp:  [97.6 F (36.4 C)-98.7 F (37.1 C)] 98.2 F (36.8 C) (12/18 0555) Pulse Rate:  [69-90] 90  (12/18 0555) Resp:  [10-18] 16  (12/18 0555) BP: (116-185)/(68-107) 116/68 mmHg (12/18 0555) SpO2:  [92 %-100 %] 92 % (12/18 0555) Weight:  [124.286 kg (274 lb)] 274 lb (124.286 kg) (12/17 1524)  Intake/Output from previous day:  Intake/Output Summary (Last 24 hours) at 05/13/11 0809 Last data filed at 05/13/11 0137  Gross per 24 hour  Intake 3342.8 ml  Output   1775 ml  Net 1567.8 ml    Intake/Output this shift:    Labs:  Basename 05/13/11 0409  HGB 13.7    Basename 05/13/11 0409  WBC 19.2*  RBC 4.39  HCT 38.7*  PLT 235    Basename 05/13/11 0409  NA 135  K 4.2  CL 100  CO2 26  BUN 9  CREATININE 0.74  GLUCOSE 145*  CALCIUM 8.8   No results found for this basename: LABPT:2,INR:2 in the last 72 hours  Exam - Neurovascular intact Sensation intact distally Dressing - clean, dry, no drainage Motor function intact - moving foot and toes well on exam.  Hemovac pulled without difficulty.  Assessment/Plan: 1 Day Post-Op Procedure(s) (LRB): TOTAL KNEE ARTHROPLASTY (Left)  Past Medical History  Diagnosis Date  . Dysrhythmia 05-09-11    epidoses of bradycardia, stress and other test negative-Lopressor d/c.  [name] Seasonal allergies 05-09-11    seasonal allergies with sinus issues  . GERD (gastroesophageal reflux disease) 05-09-11    tx. Pantoprazole  . Arthritis 05-09-11    osteoarthritis-all joints  . Depression 05-09-11    past hx. depression, none recent  . Liver enzyme elevation 05-09-11    past hx., not now-more  normal  . Abscess of buttock, right 05-09-11    lance in ERD and oral antibiotic-4 yrs ago positive MRSA, no reoccurrence    Advance diet Up with therapy Discharge home with home health when met goals  DVT Prophylaxis - Xarelto  Protocol Weight-Bearing as tolerated to left leg Keep foley until tomorrow. No vaccines. D/C PCA, Change to IV push D/C O2 and Pulse OX and try on Room Air  Blake Mcmillan 05/13/2011, 8:09 AM

## 2011-05-13 NOTE — Progress Notes (Addendum)
Physical Therapy Treatment Patient Details Name: Blake Mcmillan MRN: [id number] DOB: 1961/03/09 Today's Date: 05/13/2011  [id number] Marguerite Olea  PT Assessment/Plan  PT - Assessment/Plan PT Plan: Discharge plan remains appropriate PT Frequency: 7X/week Follow Up Recommendations: Home health PT Equipment Recommended: None recommended by PT PT Goals  Acute Rehab PT Goals PT Goal Formulation: With patient Time For Goal Achievement: 5 days Pt will go Sit to Supine/Side: with supervision PT Goal: Sit to Supine/Side - Progress: Progressing toward goal Pt will go Sit to Stand: with supervision PT Goal: Sit to Stand - Progress: Progressing toward goal Pt will go Stand to Sit: with supervision PT Goal: Stand to Sit - Progress: Progressing toward goal Pt will Ambulate: >150 feet;with least restrictive assistive device PT Goal: Ambulate - Progress: Progressing toward goal Pt will Perform Home Exercise Program: Independently PT Goal: Perform Home Exercise Program - Progress: Progressing toward goal  PT Treatment Precautions/Restrictions  Precautions Required Braces or Orthoses: Yes Knee Immobilizer: Discontinue once straight leg raise with < 10 degree lag Restrictions Weight Bearing Restrictions: Yes LLE Weight Bearing: Weight bearing as tolerated Mobility (including Balance) Bed Mobility Bed Mobility: Yes Sit to Supine - Left: 4: Min assist Sit to Supine - Left Details (indicate cue type and reason): Required assist with LLE, cues for hand placement and LE management/correct UE use  Transfers Transfers: Yes Sit to Stand: 4: Min assist;From chair/3-in-1;With upper extremity assist;With armrests Sit to Stand Details (indicate cue type and reason): Required cues for hand placement  Stand to Sit: 4: Min assist;With upper extremity assist;To bed Stand to Sit Details: requires LLE assist cues for technique Ambulation/Gait Ambulation/Gait: Yes Ambulation/Gait Assistance: 4: Min  assist Ambulation/Gait Assistance Details (indicate cue type and reason): requires cues for placement and sequencing of RW Ambulation Distance (Feet): 50 Feet Assistive device: Rolling walker Gait Pattern: Step-to pattern Gait velocity: decreased gait velocity Stairs: No Wheelchair Mobility Wheelchair Mobility: No       Exercise  Total Joint Exercises Ankle Circles/Pumps: AROM;Left;15 reps;Supine Quad Sets: AROM;Left;15 reps;Supine Heel Slides: AAROM;Left;15 reps;Supine Straight Leg Raises: AAROM;Left;15 reps;Supine   End of Session PT - End of Session Equipment Utilized During Treatment: Left knee immobilizer Activity Tolerance: Patient limited by pain Patient left: in bed;with call bell in reach General Behavior During Session: [facility] for tasks performed Cognition: [facility] for tasks performed  Page, Meribeth Mattes 05/13/2011, 3:10 PM

## 2011-05-14 LAB — BASIC METABOLIC PANEL
BUN: 9 mg/dL (ref 6–23)
CO2: 28 mEq/L (ref 19–32)
Chloride: 98 mEq/L (ref 96–112)
GFR calc non Af Amer: >90 mL/min (ref >90)
Glucose, Bld: 129 mg/dL — ABNORMAL HIGH (ref 70–99)
Potassium: 3.8 mEq/L (ref 3.5–5.1)
Sodium: 131 mEq/L — ABNORMAL LOW (ref 135–145)

## 2011-05-14 LAB — CBC
HCT: 36.7 % — ABNORMAL LOW (ref 39.0–52.0)
Hemoglobin: 12.7 g/dL — ABNORMAL LOW (ref 13.0–17.0)
RBC: 4.13 MIL/uL — ABNORMAL LOW (ref 4.22–5.81)
WBC: 19.9 10*3/uL — ABNORMAL HIGH (ref 4.0–10.5)

## 2011-05-14 MED ORDER — BISACODYL 10 MG RE SUPP
10.0000 mg | Freq: Every day | RECTAL | Status: DC | PRN
  Administered 2011-05-15: 10 mg via RECTAL
  Filled 2011-05-14: qty 1

## 2011-05-14 MED ORDER — FLEET ENEMA 7-19 GM/118ML RE ENEM: 1.0000 | ENEMA | Freq: Every day | RECTAL | Status: DC | PRN

## 2011-05-14 NOTE — Progress Notes (Signed)
Physical Therapy Treatment Patient Details Name: Blake Mcmillan MRN: [id number] DOB: 06-Jul-1960 Today's Date: 05/14/2011  [id number] G, TE PT Assessment/Plan  PT - Assessment/Plan Comments on Treatment Session: Pt tolerated ambulation and exercise well with minor increase in pain with activity.  Discussed practicing stairs during tomorrows session to prepare for D/C.  PT continues to recommend HHPT.   PT Plan: Discharge plan remains appropriate PT Frequency: 7X/week Follow Up Recommendations: Home health PT Equipment Recommended: None recommended by PT PT Goals  Acute Rehab PT Goals PT Goal Formulation: With patient Time For Goal Achievement: 5 days Pt will go Sit to Supine/Side: with supervision PT Goal: Sit to Supine/Side - Progress: Progressing toward goal Pt will go Sit to Stand: with supervision PT Goal: Sit to Stand - Progress: Partly met Pt will go Stand to Sit: with supervision PT Goal: Stand to Sit - Progress: Progressing toward goal Pt will Ambulate: >150 feet;with least restrictive assistive device PT Goal: Ambulate - Progress: Progressing toward goal Pt will Perform Home Exercise Program: Independently PT Goal: Perform Home Exercise Program - Progress: Progressing toward goal  PT Treatment Precautions/Restrictions  Precautions Required Braces or Orthoses: Yes Knee Immobilizer: Discontinue once straight leg raise with < 10 degree lag Restrictions Weight Bearing Restrictions: Yes LLE Weight Bearing: Weight bearing as tolerated Mobility (including Balance) Bed Mobility Bed Mobility: Yes Sit to Supine - Right: 4: Min assist;HOB elevated (comment degrees) (HOB elevated approx 30deg) Sit to Supine - Right Details (indicate cue type and reason): Requires assist with LLE, cues for hand placement on bed before sitting. Transfers Transfers: Yes Sit to Stand: 5: Supervision;With upper extremity assist;With armrests;From chair/3-in-1 Sit to Stand Details (indicate cue  type and reason): cues for UE placement and LE manangement Stand to Sit: 4: Min assist;With upper extremity assist;To bed Stand to Sit Details: requires LLE assist and cues for technique Ambulation/Gait Ambulation/Gait: Yes Ambulation/Gait Assistance: 4: Min assist Ambulation/Gait Assistance Details (indicate cue type and reason): requires cues for sequencing and technique with RW, upright posture Ambulation Distance (Feet): 100 Feet Assistive device: Rolling walker Gait Pattern: Step-to pattern Gait velocity: decreased gait velocity Stairs: No    Exercise  Total Joint Exercises Ankle Circles/Pumps: AROM;Left;10 reps;Supine Quad Sets: AROM;Left;10 reps;Supine Short Arc Quad: AAROM;Left;10 reps;Supine Heel Slides: AAROM;Left;10 reps;Supine Hip ABduction/ADduction: AAROM;Left;10 reps;Supine Straight Leg Raises: AAROM;Left;10 reps;Supine End of Session PT - End of Session Equipment Utilized During Treatment: Left knee immobilizer Activity Tolerance: Patient limited by pain Patient left: in bed;with call bell in reach General Behavior During Session: [facility] for tasks performed Cognition: [facility] for tasks performed  Page, Meribeth Mattes 05/14/2011, 2:26 PM

## 2011-05-14 NOTE — Progress Notes (Signed)
Occupational Therapy Note Spoke with patient who has all his DME and he doesn't feel he needs OT. States he has been up to the bathroom this am with assist and feel comfortable with 3in1 transfer. Wife can assist at discharge. Will defer OT eval per pt request. Judithann Sauger OTR/L [phone] 05/14/2011

## 2011-05-14 NOTE — Progress Notes (Signed)
Subjective: 2 Days Post-Op Procedure(s) (LRB): TOTAL KNEE ARTHROPLASTY (Left) Patient reports pain as mild.   Patient seen in rounds with Dr. Lequita Halt. Patient has complaints of no bowel movement yet  Objective: Vital signs in last 24 hours: Temp:  [98.2 F (36.8 C)-100 F (37.8 C)] 98.7 F (37.1 C) (12/19 0605) Pulse Rate:  [92-116] 92  (12/19 0605) Resp:  [12-20] 12  (12/19 0605) BP: (143-176)/(83-92) 144/83 mmHg (12/19 0605) SpO2:  [91 %-98 %] 92 % (12/19 0605)  Intake/Output from previous day:  Intake/Output Summary (Last 24 hours) at 05/14/11 0754 Last data filed at 05/14/11 0605  Gross per 24 hour  Intake 1653.67 ml  Output   2350 ml  Net -696.33 ml    Intake/Output this shift:    Labs:  Basename 05/14/11 0410 05/13/11 0409  HGB 12.7* 13.7    Basename 05/14/11 0410 05/13/11 0409  WBC 19.9* 19.2*  RBC 4.13* 4.39  HCT 36.7* 38.7*  PLT 231 235    Basename 05/14/11 0410 05/13/11 0409  NA 131* 135  K 3.8 4.2  CL 98 100  CO2 28 26  BUN 9 9  CREATININE 0.83 0.74  GLUCOSE 129* 145*  CALCIUM 8.6 8.8   No results found for this basename: LABPT:2,INR:2 in the last 72 hours  Exam - Neurologically intact Neurovascular intact Incision: no drainage Compartment soft Dressing/Incision - clean, dry, no drainage Motor function intact - moving foot and toes well on exam.    Assessment/Plan: 2 Days Post-Op Procedure(s) (LRB): TOTAL KNEE ARTHROPLASTY (Left)  Past Medical History  Diagnosis Date  . Dysrhythmia 05-09-11    epidoses of bradycardia, stress and other test negative-Lopressor d/c.  [name] Seasonal allergies 05-09-11    seasonal allergies with sinus issues  . GERD (gastroesophageal reflux disease) 05-09-11    tx. Pantoprazole  . Arthritis 05-09-11    osteoarthritis-all joints  . Depression 05-09-11    past hx. depression, none recent  . Liver enzyme elevation 05-09-11    past hx., not now-more normal  . Abscess of buttock, right 05-09-11    lance  in ERD and oral antibiotic-4 yrs ago positive MRSA, no reoccurrence    Advance diet Up with therapy D/C IV fluids Plan for discharge tomorrow  DVT Prophylaxis - Xarelto Protocol Weight-Bearing as tolerated to left leg  Dia Donate V 05/14/2011, 7:54 AM

## 2011-05-14 NOTE — Progress Notes (Signed)
05/14/2011 Raynelle Bring BSN CCM [phone] Tct pt's spouse at her request; left msg on voicemail CM spoke with patient and plans are for him to return to his home where spouse will be caregiver. States he already has DME including walker. Wants to use [place] for [facility] services. CM to follow for hh needs

## 2011-05-15 ENCOUNTER — Encounter (HOSPITAL_COMMUNITY): Payer: Self-pay | Admitting: Orthopedic Surgery

## 2011-05-15 DIAGNOSIS — E871 Hypo-osmolality and hyponatremia: Secondary | ICD-10-CM | POA: Diagnosis not present

## 2011-05-15 LAB — CBC
HCT: 36.2 % — ABNORMAL LOW (ref 39.0–52.0)
Hemoglobin: 12.5 g/dL — ABNORMAL LOW (ref 13.0–17.0)
MCH: 30.7 pg (ref 26.0–34.0)
MCHC: 34.5 g/dL (ref 30.0–36.0)
RBC: 4.07 MIL/uL — ABNORMAL LOW (ref 4.22–5.81)

## 2011-05-15 MED ORDER — OXYCODONE HCL 5 MG PO TABS: 5.0000 mg | ORAL_TABLET | ORAL | Status: AC | PRN

## 2011-05-15 MED ORDER — METHOCARBAMOL 500 MG PO TABS: 500.0000 mg | ORAL_TABLET | Freq: Four times a day (QID) | ORAL | Status: AC | PRN

## 2011-05-15 MED ORDER — RIVAROXABAN 10 MG PO TABS: 10.0000 mg | ORAL_TABLET | Freq: Every day | ORAL | Status: DC

## 2011-05-15 NOTE — Progress Notes (Signed)
Subjective: 3 Days Post-Op Procedure(s) (LRB): TOTAL KNEE ARTHROPLASTY (Left) Patient reports pain as mild.   Patient seen in rounds with Dr. Lequita Halt. Patient has complaints of mild pain but ready to go home today.  Objective: Vital signs in last 24 hours: Temp:  [98.4 F (36.9 C)-99.3 F (37.4 C)] 98.4 F (36.9 C) (12/20 0600) Pulse Rate:  [86-98] 88  (12/20 0600) Resp:  [12-16] 16  (12/20 0600) BP: (128-145)/(77-86) 128/77 mmHg (12/20 0600) SpO2:  [94 %-95 %] 95 % (12/20 0600)  Intake/Output from previous day:  Intake/Output Summary (Last 24 hours) at 05/15/11 0905 Last data filed at 05/15/11 0900  Gross per 24 hour  Intake 1866.33 ml  Output   3200 ml  Net -1333.67 ml    Intake/Output this shift: Total I/O In: 240 [P.O.:240] Out: -   Labs: Results for orders placed during the hospital encounter of 05/12/11  CBC      Component Value Range   WBC 19.2 (*) 4.0 - 10.5 (K/uL)   RBC 4.39  4.22 - 5.81 (MIL/uL)   Hemoglobin 13.7  13.0 - 17.0 (g/dL)   HCT 16.1 (*) 09.6 - 52.0 (%)   MCV 88.2  78.0 - 100.0 (fL)   MCH 31.2  26.0 - 34.0 (pg)   MCHC 35.4  30.0 - 36.0 (g/dL)   RDW 04.5  40.9 - 81.1 (%)   Platelets 235  150 - 400 (K/uL)  BASIC METABOLIC PANEL      Component Value Range   Sodium 135  135 - 145 (mEq/L)   Potassium 4.2  3.5 - 5.1 (mEq/L)   Chloride 100  96 - 112 (mEq/L)   CO2 26  19 - 32 (mEq/L)   Glucose, Bld 145 (*) 70 - 99 (mg/dL)   BUN 9  6 - 23 (mg/dL)   Creatinine, Ser 9.14  0.50 - 1.35 (mg/dL)   Calcium 8.8  8.4 - 78.2 (mg/dL)   GFR calc non Af Amer >90  >90 (mL/min)   GFR calc Af Amer >90  >90 (mL/min)  CBC      Component Value Range   WBC 19.9 (*) 4.0 - 10.5 (K/uL)   RBC 4.13 (*) 4.22 - 5.81 (MIL/uL)   Hemoglobin 12.7 (*) 13.0 - 17.0 (g/dL)   HCT 95.6 (*) 21.3 - 52.0 (%)   MCV 88.9  78.0 - 100.0 (fL)   MCH 30.8  26.0 - 34.0 (pg)   MCHC 34.6  30.0 - 36.0 (g/dL)   RDW 08.6  57.8 - 46.9 (%)   Platelets 231  150 - 400 (K/uL)  BASIC METABOLIC  PANEL      Component Value Range   Sodium 131 (*) 135 - 145 (mEq/L)   Potassium 3.8  3.5 - 5.1 (mEq/L)   Chloride 98  96 - 112 (mEq/L)   CO2 28  19 - 32 (mEq/L)   Glucose, Bld 129 (*) 70 - 99 (mg/dL)   BUN 9  6 - 23 (mg/dL)   Creatinine, Ser 6.29  0.50 - 1.35 (mg/dL)   Calcium 8.6  8.4 - 52.8 (mg/dL)   GFR calc non Af Amer >90  >90 (mL/min)   GFR calc Af Amer >90  >90 (mL/min)  CBC      Component Value Range   WBC 18.9 (*) 4.0 - 10.5 (K/uL)   RBC 4.07 (*) 4.22 - 5.81 (MIL/uL)   Hemoglobin 12.5 (*) 13.0 - 17.0 (g/dL)   HCT 41.3 (*) 24.4 - 52.0 (%)   MCV  88.9  78.0 - 100.0 (fL)   MCH 30.7  26.0 - 34.0 (pg)   MCHC 34.5  30.0 - 36.0 (g/dL)   RDW 57.8  46.9 - 62.9 (%)   Platelets 249  150 - 400 (K/uL)    Exam: Neurovascular intact Sensation intact distally Incision - clean, dry Motor function intact - moving foot and toes well on exam.   Assessment/Plan: 3 Days Post-Op Procedure(s) (LRB): TOTAL KNEE ARTHROPLASTY (Left) Up with therapy Discharge home with home health Procedure(s) (LRB): TOTAL KNEE ARTHROPLASTY (Left) Past Medical History  Diagnosis Date  . Dysrhythmia 05-09-11    epidoses of bradycardia, stress and other test negative-Lopressor d/c.  [name] Seasonal allergies 05-09-11    seasonal allergies with sinus issues  . GERD (gastroesophageal reflux disease) 05-09-11    tx. Pantoprazole  . Arthritis 05-09-11    osteoarthritis-all joints  . Depression 05-09-11    past hx. depression, none recent  . Liver enzyme elevation 05-09-11    past hx., not now-more normal  . Abscess of buttock, right 05-09-11    lance in ERD and oral antibiotic-4 yrs ago positive MRSA, no reoccurrence    Diet - regular Follow up - in 2 weeks Activity - WBAT Condition Upon Discharge - Good D/C Meds - Oxy IR, Robaxin, Xarelto DVT Prophylaxis - Xarelto Protocol   Jen Benedict 05/15/2011, 9:05 AM

## 2011-05-15 NOTE — Progress Notes (Signed)
Discharge summary sent to payer through [facility]  

## 2011-05-15 NOTE — Progress Notes (Signed)
05/15/2011 Axl Rodino,rn bsn ccm [id number] Pt discharged today; no further hh or dme others/has been set up with Genevieve Norlander for hhpt

## 2011-05-21 NOTE — Discharge Summary (Signed)
Physician Discharge Summary   Patient ID: Blake Mcmillan MRN: [id number] DOB/AGE: 50-Apr-1962 50 y.o.  Admit date: 05/12/2011 Discharge date: 05/15/2011  Primary Diagnosis: Osteoarthritis Left Knee  Admission Diagnoses: Past Medical History  Diagnosis Date  . Dysrhythmia 05-09-11    epidoses of bradycardia, stress and other test negative-Lopressor d/c.  [name] Seasonal allergies 05-09-11    seasonal allergies with sinus issues  . GERD (gastroesophageal reflux disease) 05-09-11    tx. Pantoprazole  . Arthritis 05-09-11    osteoarthritis-all joints  . Depression 05-09-11    past hx. depression, none recent  . Liver enzyme elevation 05-09-11    past hx., not now-more normal  . Abscess of buttock, right 05-09-11    lance in ERD and oral antibiotic-4 yrs ago positive MRSA, no reoccurrence    Discharge Diagnoses:  Active Problems:  Postop Hyponatremia   Procedure: Procedure(s) (LRB): TOTAL KNEE ARTHROPLASTY (Left)   Consults: none  HPI: Blake Mcmillan is a 50 y.o. year old male with end stage OA of his left knee with progressively worsening pain and dysfunction. He has constant pain, with activity and at rest and significant functional deficits with difficulties even with ADLs. He has had extensive non-op management including analgesics, injections of cortisone and viscosupplements, and home exercise program, but remains in significant pain with significant dysfunction. Radiographs show bone on bone arthritis lateral and patellofemoral. He presents now for left Total Knee Arthroplasty.   Laboratory Data: Hospital Outpatient Visit on 05/09/2011  Component Date Value Range Status  . MRSA, PCR  05/09/2011 NEGATIVE  NEGATIVE Final  . Staphylococcus aureus  05/09/2011 NEGATIVE  NEGATIVE Final   Comment:                                 The Xpert SA Assay (FDA                          approved for NASAL specimens                          only), is one component of                a comprehensive surveillance                          program.  It is not intended                          to diagnose infection nor to                          guide or monitor treatment.  [name] aPTT (seconds) 05/09/2011 37  24-37 Final   Comment:                                 IF BASELINE aPTT IS ELEVATED,                          SUGGEST PATIENT RISK ASSESSMENT                          BE USED TO DETERMINE APPROPRIATE  ANTICOAGULANT THERAPY.  . WBC (K/uL) 05/09/2011 12.2* 4.0-10.5 Final  . RBC (MIL/uL) 05/09/2011 5.33  4.22-5.81 Final  . Hemoglobin (g/dL) 16/02/9603 54.0  98.1-19.1 Final  . HCT (%) 05/09/2011 46.9  39.0-52.0 Final  . MCV (fL) 05/09/2011 88.0  78.0-100.0 Final  . MCH (pg) 05/09/2011 31.1  26.0-34.0 Final  . MCHC (g/dL) 47/82/9562 13.0  86.5-78.4 Final  . RDW (%) 05/09/2011 13.1  11.5-15.5 Final  . Platelets (K/uL) 05/09/2011 282  150-400 Final  . Sodium (mEq/L) 05/09/2011 138  135-145 Final  . Potassium (mEq/L) 05/09/2011 4.0  3.5-5.1 Final  . Chloride (mEq/L) 05/09/2011 102  96-112 Final  . CO2 (mEq/L) 05/09/2011 27  19-32 Final  . Glucose, Bld (mg/dL) 69/62/9528 413* 24-40 Final  . BUN (mg/dL) 03/22/2535 11  6-44 Final  . Creatinine, Ser (mg/dL) 03/47/4259 5.63  8.75-6.43 Final  . Calcium (mg/dL) 32/95/1884 9.7  1.6-60.6 Final  . Total Protein (g/dL) 30/16/0109 8.6* 3.2-3.5 Final  . Albumin (g/dL) 57/32/2025 3.8  4.2-7.0 Final  . AST (U/L) 05/09/2011 78* 0-37 Final  . ALT (U/L) 05/09/2011 104* 0-53 Final  . Alkaline Phosphatase (U/L) 05/09/2011 75  39-117 Final  . Total Bilirubin (mg/dL) 62/37/6283 0.4  1.5-1.7 Final  . GFR calc non Af Amer (mL/min) 05/09/2011 >90  >90 Final  . GFR calc Af Amer (mL/min) 05/09/2011 >90  >90 Final   Comment:                                 The eGFR has been calculated                          using the CKD EPI equation.                          This calculation has not been                           validated in all clinical                          situations.                          eGFR's persistently                          <90 mL/min signify                          possible Chronic Kidney Disease.  [name] Prothrombin Time (seconds) 05/09/2011 13.9  11.6-15.2 Final  . INR  05/09/2011 1.05  0.00-1.49 Final  . Color, Urine  05/09/2011 YELLOW  YELLOW Final  . APPearance  05/09/2011 CLEAR  CLEAR Final  . Specific Gravity, Urine  05/09/2011 1.025  1.005-1.030 Final  . pH  05/09/2011 5.5  5.0-8.0 Final  . Glucose, UA (mg/dL) 61/60/7371 NEGATIVE  NEGATIVE Final  . Hgb urine dipstick  05/09/2011 NEGATIVE  NEGATIVE Final  . Bilirubin Urine  05/09/2011 NEGATIVE  NEGATIVE Final  . Ketones, ur (mg/dL) 11/19/9483 NEGATIVE  NEGATIVE Final  . Protein, ur (mg/dL) 46/27/0350 NEGATIVE  NEGATIVE Final  . Urobilinogen, UA (mg/dL) 09/38/1829 0.2  9.3-7.1 Final  . Nitrite  05/09/2011 NEGATIVE  NEGATIVE Final  . Leukocytes, UA  05/09/2011 NEGATIVE  NEGATIVE Final   MICROSCOPIC NOT DONE ON URINES WITH NEGATIVE PROTEIN, BLOOD, LEUKOCYTES, NITRITE, OR GLUCOSE <1000 mg/dL.  . ABO/RH(D)  05/09/2011 A NEG   Final  . Antibody Screen  05/09/2011 NEG   Final  . Sample Expiration  05/09/2011 05/15/2011   Final  . ABO/RH(D)  05/09/2011 A NEG   Final   No results found for this basename: HGB:5 in the last 72 hours No results found for this basename: WBC:2,RBC:2,HCT:2,PLT:2 in the last 72 hours No results found for this basename: NA:2,K:2,CL:2,CO2:2,BUN:2,CREATININE:2,GLUCOSE:2,CALCIUM:2 in the last 72 hours No results found for this basename: LABPT:2,INR:2 in the last 72 hours  X-Rays:No results found.  EKG: Orders placed in visit on 05/12/11  . EKG 12-LEAD     Hospital Course: Patient was admitted to [facility] and taken to the OR and underwent the above state procedure without complications.  Patient tolerated the procedure well and was later transferred to the recovery room and then to  the orthopaedic floor for postoperative care.  They were given PO and IV analgesics for pain control following their surgery.  They were given 24 hours of postoperative antibiotics and started on DVT prophylaxis.   PT and OT were ordered for total joint protocol.  Discharge planning consulted to help with postop disposition and equipment needs.  Patient had a decent night on the evening of surgery and able to get some rest and started to get up with therapy on day one and walked 50 feet.  PCA was discontinued and they were weaned over to PO meds.  Hemovac drain was pulled without difficulty.  Continued to progress with therapy into day two.  Dressing was changed on day two and the incision was looking good.  By day three, the patient had progressed with therapy and meeting goals.  Incision was healing well.  Patient was seen in rounds and was ready to go home.  Discharge Medications: Prior to Admission medications   Medication Sig Start Date End Date Taking? Authorizing Provider  pantoprazole (PROTONIX) 40 MG tablet Take 40 mg by mouth daily.     Yes Historical Provider, MD  methocarbamol (ROBAXIN) 500 MG tablet Take 1 tablet (500 mg total) by mouth every 6 (six) hours as needed. 05/15/11 05/25/11  Alexzandrew Perkins, PA  oxyCODONE (OXY IR/ROXICODONE) 5 MG immediate release tablet Take 1-2 tablets (5-10 mg total) by mouth every 4 (four) hours as needed for pain. 05/15/11 05/25/11  Alexzandrew Julien Girt, PA  rivaroxaban (XARELTO) 10 MG TABS tablet Take 1 tablet (10 mg total) by mouth daily with breakfast. 05/15/11   Alexzandrew Julien Girt, PA  sodium chloride (OCEAN) 0.65 % nasal spray Place 2 sprays into the nose 2 (two) times daily.      Historical Provider, MD    Diet: regular  Activity:WBAT   Follow-up:in 2 weeks  Disposition: home  Discharged Condition: good   Discharge Orders    Future Orders Please Complete By Expires   Diet - low sodium heart healthy      Call MD / Call 911       Comments:   If you experience chest pain or shortness of breath, CALL 911 and be transported to the hospital emergency room.  If you develope a fever above 101 F, pus (white drainage) or increased drainage or redness at the wound, or calf pain, call your surgeon's office.   Constipation Prevention      Comments:  Drink plenty of fluids.  Prune juice may be helpful.  You may use a stool softener, such as Colace (over the counter) 100 mg twice a day.  Use MiraLax (over the counter) for constipation as needed.   Increase activity slowly as tolerated      Weight Bearing as taught in Physical Therapy      Comments:   Use a walker or crutches as instructed.   Discharge instructions      Comments:   Pick up stool softner and laxative for home. Do not submerge incision under water. May shower. Continue to use ice for pain and swelling from surgery.    Driving restrictions      Comments:   No driving   Lifting restrictions      Comments:   No lifting   TED hose      Comments:   Use stockings (TED hose) for 3 weeks on both leg(s).  You may remove them at night for sleeping.   Change dressing      Comments:   Change dressing daily with sterile 4 x 4 inch gauze dressing and apply TED hose.   Do not put a pillow under the knee. Place it under the heel.        Discharge Medication List as of 05/15/2011  9:19 AM    START taking these medications   Details  methocarbamol (ROBAXIN) 500 MG tablet Take 1 tablet (500 mg total) by mouth every 6 (six) hours as needed., Starting 05/15/2011, Until Sun 05/25/11, Print    oxyCODONE (OXY IR/ROXICODONE) 5 MG immediate release tablet Take 1-2 tablets (5-10 mg total) by mouth every 4 (four) hours as needed for pain., Starting 05/15/2011, Until Sun 05/25/11, Print    rivaroxaban (XARELTO) 10 MG TABS tablet Take 1 tablet (10 mg total) by mouth daily with breakfast., Starting 05/15/2011, Until Discontinued, Print      CONTINUE these medications which have  NOT CHANGED   Details  pantoprazole (PROTONIX) 40 MG tablet Take 40 mg by mouth daily.  , Until Discontinued, Historical Med    sodium chloride (OCEAN) 0.65 % nasal spray Place 2 sprays into the nose 2 (two) times daily.  , Until Discontinued, Historical Med      STOP taking these medications     aspirin EC 81 MG tablet      meloxicam (MOBIC) 7.5 MG tablet      Multiple Vitamins-Minerals (MULTIVITAMINS THER. W/MINERALS) TABS        Follow-up Information    Follow up with [name],FRANK V. Make an appointment in 2 weeks.   Contact information:   [facility] [address] [place] [address] [phone]          Signed: Patrica Duel 05/21/2011, 2:39 PM

## 2013-12-29 ENCOUNTER — Other Ambulatory Visit: Payer: Self-pay | Admitting: Orthopedic Surgery

## 2013-12-29 NOTE — Progress Notes (Signed)
Preoperative surgical orders have been place into the Epic hospital system for Thyra BreedWilliam J Rabe on 12/29/2013, 9:59 AM  by Patrica DuelPERKINS, Khyre Germond for surgery on 02/01/2014.  Preop Knee Scope orders including IV Tylenol and IV Decadron as long as there are no contraindications to the above medications. Avel Peacerew Dorthula Bier, PA-C .

## 2014-01-25 ENCOUNTER — Encounter (HOSPITAL_COMMUNITY): Payer: Self-pay

## 2014-01-25 NOTE — Patient Instructions (Addendum)
Ebony Rickel Caster  01/25/2014                           YOUR PROCEDURE IS SCHEDULED ON: 02/01/14               ENTER THRU [facility] MAIN HOSPITAL ENTRANCE AND                            FOLLOW  SIGNS TO SHORT STAY CENTER                 ARRIVE AT SHORT STAY AT:  8:15 AM               CALL THIS NUMBER IF ANY PROBLEMS THE DAY OF SURGERY :               [phone]                                REMEMBER:   Do not eat food or drink liquids AFTER MIDNIGHT                  Take these medicines the morning of surgery with               A SIPS OF WATER :  PROTONIX / MAY USE NASAL SPRAY      Do not wear jewelry, make-up   Do not wear lotions, powders, or perfumes.   Do not shave legs or underarms 12 hrs. before surgery (men may shave face)  Do not bring valuables to the hospital.  Contacts, dentures or bridgework may not be worn into surgery.  Leave suitcase in the car. After surgery it may be brought to your room.  For patients admitted to the hospital more than one night, checkout time is            11:00 AM                                                       The day of discharge.   Patients discharged the day of surgery will not be allowed to drive home.            If going home same day of surgery, must have someone stay with you              FIRST 24 hrs at home and arrange for some one to drive you              home from hospital.   ________________________________________________________________________                                                                        [facility] - PREPARING FOR SURGERY  Before surgery, you can play an important role.  Because skin is not sterile, your skin needs to be as free of germs as possible.  You can reduce the number of germs  on your skin by washing with CHG (chlorahexidine gluconate) soap before surgery.  CHG is an antiseptic cleaner which kills germs and bonds with the skin to continue killing germs even after  washing. Please DO NOT use if you have an allergy to CHG or antibacterial soaps.  If your skin becomes reddened/irritated stop using the CHG and inform your nurse when you arrive at Short Stay. Do not shave (including legs and underarms) for at least 48 hours prior to the first CHG shower.  You may shave your face. Please follow these instructions carefully:   1.  Shower with CHG Soap the night before surgery and the  morning of Surgery.   2.  If you choose to wash your hair, wash your hair first as usual with your  normal  Shampoo.   3.  After you shampoo, rinse your hair and body thoroughly to remove the  shampoo.                                         4.  Use CHG as you would any other liquid soap.  You can apply chg directly  to the skin and wash . Gently wash with scrungie or clean wascloth    5.  Apply the CHG Soap to your body ONLY FROM THE NECK DOWN.   Do not use on open                           Wound or open sores. Avoid contact with eyes, ears mouth and genitals (private parts).                        Genitals (private parts) with your normal soap.              6.  Wash thoroughly, paying special attention to the area where your surgery  will be performed.   7.  Thoroughly rinse your body with warm water from the neck down.   8.  DO NOT shower/wash with your normal soap after using and rinsing off  the CHG Soap .                9.  Pat yourself dry with a clean towel.             10.  Wear clean pajamas.             11.  Place clean sheets on your bed the night of your first shower and do not  sleep with pets.  Day of Surgery : Do not apply any lotions/deodorants the morning of surgery.  Please wear clean clothes to the hospital/surgery center.  FAILURE TO FOLLOW THESE INSTRUCTIONS MAY RESULT IN THE CANCELLATION OF YOUR SURGERY    PATIENT  SIGNATURE_________________________________  ______________________________________________________________________     Rogelia Mire  An incentive spirometer is a tool that can help keep your lungs clear and active. This tool measures how well you are filling your lungs with each breath. Taking long deep breaths may help reverse or decrease the chance of developing breathing (pulmonary) problems (especially infection) following:  A long period of time when you are unable to move or be active. BEFORE THE PROCEDURE   If the spirometer includes an indicator to show your best effort, your nurse or respiratory therapist will set it to  a desired goal.  If possible, sit up straight or lean slightly forward. Try not to slouch.  Hold the incentive spirometer in an upright position. INSTRUCTIONS FOR USE  1. Sit on the edge of your bed if possible, or sit up as far as you can in bed or on a chair. 2. Hold the incentive spirometer in an upright position. 3. Breathe out normally. 4. Place the mouthpiece in your mouth and seal your lips tightly around it. 5. Breathe in slowly and as deeply as possible, raising the piston or the ball toward the top of the column. 6. Hold your breath for 3-5 seconds or for as long as possible. Allow the piston or ball to fall to the bottom of the column. 7. Remove the mouthpiece from your mouth and breathe out normally. 8. Rest for a few seconds and repeat Steps 1 through 7 at least 10 times every 1-2 hours when you are awake. Take your time and take a few normal breaths between deep breaths. 9. The spirometer may include an indicator to show your best effort. Use the indicator as a goal to work toward during each repetition. 10. After each set of 10 deep breaths, practice coughing to be sure your lungs are clear. If you have an incision (the cut made at the time of surgery), support your incision when coughing by placing a pillow or rolled up towels firmly  against it. Once you are able to get out of bed, walk around indoors and cough well. You may stop using the incentive spirometer when instructed by your caregiver.  RISKS AND COMPLICATIONS  Take your time so you do not get dizzy or light-headed.  If you are in pain, you may need to take or ask for pain medication before doing incentive spirometry. It is harder to take a deep breath if you are having pain. AFTER USE  Rest and breathe slowly and easily.  It can be helpful to keep track of a log of your progress. Your caregiver can provide you with a simple table to help with this. If you are using the spirometer at home, follow these instructions: [place] IF:   You are having difficultly using the spirometer.  You have trouble using the spirometer as often as instructed.  Your pain medication is not giving enough relief while using the spirometer.  You develop fever of 100.5 F (38.1 C) or higher. SEEK IMMEDIATE MEDICAL CARE IF:   You cough up bloody sputum that had not been present before.  You develop fever of 102 F (38.9 C) or greater.  You develop worsening pain at or near the incision site. MAKE SURE YOU:   Understand these instructions.  Will watch your condition.  Will get help right away if you are not doing well or get worse. Document Released: 09/22/2006 Document Revised: 08/04/2011 Document Reviewed: 11/23/2006 [facility] Patient Information 2014 [place], [place].   ________________________________________________________________________

## 2014-01-27 ENCOUNTER — Encounter (HOSPITAL_COMMUNITY): Payer: Self-pay | Admitting: Pharmacy Technician

## 2014-01-27 ENCOUNTER — Encounter (HOSPITAL_COMMUNITY)
Admission: RE | Admit: 2014-01-27 | Discharge: 2014-01-27 | Disposition: A | Discharge status: Home / Self Care | Payer: BC Managed Care – PPO | Source: Ambulatory Visit | Attending: Orthopedic Surgery | Admitting: Orthopedic Surgery

## 2014-01-27 ENCOUNTER — Ambulatory Visit (HOSPITAL_COMMUNITY)
Admission: RE | Admit: 2014-01-27 | Discharge: 2014-01-27 | Disposition: A | Discharge status: Home / Self Care | Payer: BC Managed Care – PPO | Source: Ambulatory Visit | Attending: Anesthesiology | Admitting: Anesthesiology

## 2014-01-27 ENCOUNTER — Encounter (HOSPITAL_COMMUNITY): Payer: Self-pay

## 2014-01-27 DIAGNOSIS — I10 Essential (primary) hypertension: Secondary | ICD-10-CM | POA: Insufficient documentation

## 2014-01-27 DIAGNOSIS — Z87891 Personal history of nicotine dependence: Secondary | ICD-10-CM | POA: Diagnosis not present

## 2014-01-27 DIAGNOSIS — Z01818 Encounter for other preprocedural examination: Secondary | ICD-10-CM | POA: Insufficient documentation

## 2014-01-27 HISTORY — DX: Synovitis and tenosynovitis, unspecified: M65.9

## 2014-01-27 HISTORY — DX: Other synovitis and tenosynovitis, other site: M65.88

## 2014-01-27 HISTORY — DX: Essential (primary) hypertension: I10

## 2014-01-27 LAB — BASIC METABOLIC PANEL
Anion gap: 13 (ref 5–15)
BUN: 13 mg/dL (ref 6–23)
CHLORIDE: 97 meq/L (ref 96–112)
CO2: 27 meq/L (ref 19–32)
CREATININE: 0.92 mg/dL (ref 0.50–1.35)
Calcium: 10 mg/dL (ref 8.4–10.5)
GFR calc Af Amer: >90 mL/min (ref >90)
GFR calc non Af Amer: >90 mL/min (ref >90)
Glucose, Bld: 103 mg/dL — ABNORMAL HIGH (ref 70–99)
Potassium: 4.7 mEq/L (ref 3.7–5.3)
Sodium: 137 mEq/L (ref 137–147)

## 2014-01-27 LAB — CBC
HEMATOCRIT: 46.5 % (ref 39.0–52.0)
Hemoglobin: 16.5 g/dL (ref 13.0–17.0)
MCH: 31.8 pg (ref 26.0–34.0)
MCHC: 35.5 g/dL (ref 30.0–36.0)
MCV: 89.6 fL (ref 78.0–100.0)
Platelets: 276 10*3/uL (ref 150–400)
RBC: 5.19 MIL/uL (ref 4.22–5.81)
RDW: 12.9 % (ref 11.5–15.5)
WBC: 11.5 10*3/uL — ABNORMAL HIGH (ref 4.0–10.5)

## 2014-01-27 NOTE — Progress Notes (Signed)
01/27/14 1409  OBSTRUCTIVE SLEEP APNEA  Have you ever been diagnosed with sleep apnea through a sleep study? No  Do you snore loudly (loud enough to be heard through closed doors)?  1  Do you often feel tired, fatigued, or sleepy during the daytime? 1  Has anyone observed you stop breathing during your sleep? 1  Do you have, or are you being treated for high blood pressure? 1  BMI more than 35 kg/m2? 1  Age over 53 years old? 1  Neck circumference greater than 40 cm/16 inches? 1  Gender: 1  Obstructive Sleep Apnea Score 8  Score 4 or greater  Results sent to PCP

## 2014-01-31 MED ORDER — DEXTROSE 5 % IV SOLN
3.0000 g | INTRAVENOUS | Status: AC
  Administered 2014-02-01: 3 g via INTRAVENOUS
  Filled 2014-01-31: qty 3000

## 2014-02-01 ENCOUNTER — Ambulatory Visit (HOSPITAL_COMMUNITY): Payer: BC Managed Care – PPO | Admitting: *Deleted

## 2014-02-01 ENCOUNTER — Encounter (HOSPITAL_COMMUNITY): Payer: Self-pay | Admitting: *Deleted

## 2014-02-01 ENCOUNTER — Encounter (HOSPITAL_COMMUNITY)
Admission: RE | Disposition: A | Discharge status: Home / Self Care | Payer: Self-pay | Source: Ambulatory Visit | Attending: Orthopedic Surgery

## 2014-02-01 ENCOUNTER — Encounter (HOSPITAL_COMMUNITY): Payer: BC Managed Care – PPO | Admitting: *Deleted

## 2014-02-01 ENCOUNTER — Ambulatory Visit (HOSPITAL_COMMUNITY)
Admission: RE | Admit: 2014-02-01 | Discharge: 2014-02-01 | Disposition: A | Discharge status: Home / Self Care | Payer: BC Managed Care – PPO | Source: Ambulatory Visit | Attending: Orthopedic Surgery | Admitting: Orthopedic Surgery

## 2014-02-01 DIAGNOSIS — Z87891 Personal history of nicotine dependence: Secondary | ICD-10-CM | POA: Diagnosis not present

## 2014-02-01 DIAGNOSIS — R29898 Other symptoms and signs involving the musculoskeletal system: Secondary | ICD-10-CM | POA: Insufficient documentation

## 2014-02-01 DIAGNOSIS — M6580 Other synovitis and tenosynovitis, unspecified site: Secondary | ICD-10-CM | POA: Diagnosis present

## 2014-02-01 DIAGNOSIS — K219 Gastro-esophageal reflux disease without esophagitis: Secondary | ICD-10-CM | POA: Diagnosis not present

## 2014-02-01 DIAGNOSIS — Z79899 Other long term (current) drug therapy: Secondary | ICD-10-CM | POA: Diagnosis not present

## 2014-02-01 DIAGNOSIS — Z96659 Presence of unspecified artificial knee joint: Secondary | ICD-10-CM | POA: Insufficient documentation

## 2014-02-01 DIAGNOSIS — I10 Essential (primary) hypertension: Secondary | ICD-10-CM | POA: Diagnosis not present

## 2014-02-01 DIAGNOSIS — Z7982 Long term (current) use of aspirin: Secondary | ICD-10-CM | POA: Diagnosis not present

## 2014-02-01 DIAGNOSIS — M25862 Other specified joint disorders, left knee: Secondary | ICD-10-CM

## 2014-02-01 HISTORY — PX: KNEE ARTHROSCOPY: SHX127

## 2014-02-01 SURGERY — ARTHROSCOPY, KNEE
Anesthesia: General | Site: Knee | Laterality: Left

## 2014-02-01 MED ORDER — PROPOFOL 10 MG/ML IV BOLUS
INTRAVENOUS | Status: AC
  Filled 2014-02-01: qty 20

## 2014-02-01 MED ORDER — LIDOCAINE HCL (CARDIAC) 20 MG/ML IV SOLN
INTRAVENOUS | Status: AC
  Filled 2014-02-01: qty 5

## 2014-02-01 MED ORDER — LACTATED RINGERS IV SOLN
INTRAVENOUS | Status: DC | PRN
  Administered 2014-02-01: 10:00:00 via INTRAVENOUS

## 2014-02-01 MED ORDER — PROPOFOL 10 MG/ML IV BOLUS
INTRAVENOUS | Status: DC | PRN
  Administered 2014-02-01: 230 mg via INTRAVENOUS

## 2014-02-01 MED ORDER — PROMETHAZINE HCL 25 MG/ML IJ SOLN: 6.2500 mg | INTRAMUSCULAR | Status: DC | PRN

## 2014-02-01 MED ORDER — BUPIVACAINE-EPINEPHRINE (PF) 0.25% -1:200000 IJ SOLN
INTRAMUSCULAR | Status: AC
  Filled 2014-02-01: qty 30

## 2014-02-01 MED ORDER — ONDANSETRON HCL 4 MG/2ML IJ SOLN
INTRAMUSCULAR | Status: DC | PRN
  Administered 2014-02-01: 4 mg via INTRAVENOUS

## 2014-02-01 MED ORDER — LACTATED RINGERS IV SOLN
INTRAVENOUS | Status: DC
  Administered 2014-02-01: 1000 mL via INTRAVENOUS

## 2014-02-01 MED ORDER — LIDOCAINE HCL 1 % IJ SOLN
INTRAMUSCULAR | Status: DC | PRN
  Administered 2014-02-01: 50 mg via INTRADERMAL

## 2014-02-01 MED ORDER — FENTANYL CITRATE 0.05 MG/ML IJ SOLN
INTRAMUSCULAR | Status: DC | PRN
  Administered 2014-02-01: 25 ug via INTRAVENOUS
  Administered 2014-02-01: 100 ug via INTRAVENOUS
  Administered 2014-02-01: 25 ug via INTRAVENOUS
  Administered 2014-02-01 (×2): 50 ug via INTRAVENOUS

## 2014-02-01 MED ORDER — FENTANYL CITRATE 0.05 MG/ML IJ SOLN: 25.0000 ug | INTRAMUSCULAR | Status: DC | PRN

## 2014-02-01 MED ORDER — ACETAMINOPHEN 10 MG/ML IV SOLN
1000.0000 mg | Freq: Once | INTRAVENOUS | Status: AC
  Administered 2014-02-01: 1000 mg via INTRAVENOUS
  Filled 2014-02-01: qty 100

## 2014-02-01 MED ORDER — SODIUM CHLORIDE 0.9 % IV SOLN: INTRAVENOUS | Status: DC

## 2014-02-01 MED ORDER — HYDROCODONE-ACETAMINOPHEN 5-325 MG PO TABS
1.0000 | ORAL_TABLET | Freq: Four times a day (QID) | ORAL | Status: DC | PRN
Start: 2014-02-01 — End: 2016-08-21

## 2014-02-01 MED ORDER — ONDANSETRON HCL 4 MG/2ML IJ SOLN
INTRAMUSCULAR | Status: AC
  Filled 2014-02-01: qty 2

## 2014-02-01 MED ORDER — FENTANYL CITRATE 0.05 MG/ML IJ SOLN
INTRAMUSCULAR | Status: AC
  Filled 2014-02-01: qty 5

## 2014-02-01 MED ORDER — CHLORHEXIDINE GLUCONATE 4 % EX LIQD: 60.0000 mL | Freq: Once | CUTANEOUS | Status: DC

## 2014-02-01 MED ORDER — METHOCARBAMOL 500 MG PO TABS: 500.0000 mg | ORAL_TABLET | Freq: Four times a day (QID) | ORAL | Status: DC

## 2014-02-01 MED ORDER — DEXAMETHASONE SODIUM PHOSPHATE 10 MG/ML IJ SOLN
10.0000 mg | Freq: Once | INTRAMUSCULAR | Status: AC
  Administered 2014-02-01: 10 mg via INTRAVENOUS

## 2014-02-01 MED ORDER — LACTATED RINGERS IV SOLN: INTRAVENOUS | Status: DC

## 2014-02-01 MED ORDER — MIDAZOLAM HCL 2 MG/2ML IJ SOLN
INTRAMUSCULAR | Status: AC
  Filled 2014-02-01: qty 2

## 2014-02-01 MED ORDER — SODIUM CHLORIDE 0.9 % IR SOLN
Status: DC | PRN
  Administered 2014-02-01 (×3): 3000 mL

## 2014-02-01 MED ORDER — MEPERIDINE HCL 50 MG/ML IJ SOLN: 6.2500 mg | INTRAMUSCULAR | Status: DC | PRN

## 2014-02-01 MED ORDER — METHOCARBAMOL 1000 MG/10ML IJ SOLN
500.0000 mg | Freq: Once | INTRAVENOUS | Status: AC
  Administered 2014-02-01: 500 mg via INTRAVENOUS
  Filled 2014-02-01: qty 5

## 2014-02-01 MED ORDER — BUPIVACAINE-EPINEPHRINE 0.25% -1:200000 IJ SOLN
INTRAMUSCULAR | Status: DC | PRN
  Administered 2014-02-01: 20 mL

## 2014-02-01 MED ORDER — MIDAZOLAM HCL 5 MG/5ML IJ SOLN
INTRAMUSCULAR | Status: DC | PRN
  Administered 2014-02-01: 2 mg via INTRAVENOUS

## 2014-02-01 SURGICAL SUPPLY — 26 items
BANDAGE ELASTIC 6 VELCRO ST LF (GAUZE/BANDAGES/DRESSINGS) ×3 IMPLANT
BLADE 4.2CUDA (BLADE) ×3 IMPLANT
CLOTH BEACON ORANGE TIMEOUT ST (SAFETY) ×3 IMPLANT
COUNTER NEEDLE 20 DBL MAG RED (NEEDLE) ×3 IMPLANT
CUFF TOURN SGL QUICK 34 (TOURNIQUET CUFF) ×2
CUFF TRNQT CYL 34X4X40X1 (TOURNIQUET CUFF) ×1 IMPLANT
DRAPE U-SHAPE 47X51 STRL (DRAPES) ×3 IMPLANT
DRSG EMULSION OIL 3X3 NADH (GAUZE/BANDAGES/DRESSINGS) ×3 IMPLANT
DURAPREP 26ML APPLICATOR (WOUND CARE) ×3 IMPLANT
GAUZE SPONGE 4X4 12PLY STRL (GAUZE/BANDAGES/DRESSINGS) ×3 IMPLANT
GLOVE BIO SURGEON STRL SZ8 (GLOVE) ×3 IMPLANT
GLOVE BIOGEL PI IND STRL 8 (GLOVE) ×1 IMPLANT
GLOVE BIOGEL PI INDICATOR 8 (GLOVE) ×2
GOWN STRL REUS W/TWL LRG LVL3 (GOWN DISPOSABLE) ×3 IMPLANT
MANIFOLD NEPTUNE II (INSTRUMENTS) ×3 IMPLANT
PACK ARTHROSCOPY WL (CUSTOM PROCEDURE TRAY) ×3 IMPLANT
PACK ICE MAXI GEL EZY WRAP (MISCELLANEOUS) ×9 IMPLANT
PAD ABD 8X10 STRL (GAUZE/BANDAGES/DRESSINGS) ×3 IMPLANT
PADDING CAST COTTON 6X4 STRL (CAST SUPPLIES) ×3 IMPLANT
POSITIONER SURGICAL ARM (MISCELLANEOUS) ×3 IMPLANT
SET ARTHROSCOPY TUBING (MISCELLANEOUS) ×2
SET ARTHROSCOPY TUBING LN (MISCELLANEOUS) ×1 IMPLANT
SUT ETHILON 4 0 PS 2 18 (SUTURE) ×3 IMPLANT
TOWEL OR 17X26 10 PK STRL BLUE (TOWEL DISPOSABLE) ×3 IMPLANT
WAND 90 DEG TURBOVAC W/CORD (SURGICAL WAND) ×3 IMPLANT
WRAP KNEE MAXI GEL POST OP (GAUZE/BANDAGES/DRESSINGS) ×3 IMPLANT

## 2014-02-01 NOTE — Interval H&P Note (Signed)
History and Physical Interval Note:  02/01/2014 10:01 AM  Blake Mcmillan  has presented today for surgery, with the diagnosis of LEFT KNEE HYPERTROPHIC SYNOVITIS   The various methods of treatment have been discussed with the patient and family. After consideration of risks, benefits and other options for treatment, the patient has consented to  Procedure(s): LEFT KNEE ARTHROSCOPY WITH SYNOVECTOMY  (Left) as a surgical intervention .  The patient's history has been reviewed, patient examined, no change in status, stable for surgery.  I have reviewed the patient's chart and labs.  Questions were answered to the patient's satisfaction.     Loanne Drilling

## 2014-02-01 NOTE — Anesthesia Postprocedure Evaluation (Signed)
  Anesthesia Post-op Note  Patient: Blake Mcmillan  Procedure(s) Performed: Procedure(s) (LRB): LEFT KNEE ARTHROSCOPY WITH SYNOVECTOMY  (Left)  Patient Location: PACU  Anesthesia Type: General  Level of Consciousness: awake and alert   Airway and Oxygen Therapy: Patient Spontanous Breathing  Post-op Pain: mild  Post-op Assessment: Post-op Vital signs reviewed, Patient's Cardiovascular Status Stable, Respiratory Function Stable, Patent Airway and No signs of Nausea or vomiting  Last Vitals:  Filed Vitals:   02/01/14 1227  BP: 125/79  Pulse: 77  Temp:   Resp: 16    Post-op Vital Signs: stable   Complications: No apparent anesthesia complications

## 2014-02-01 NOTE — Brief Op Note (Signed)
02/01/2014  10:57 AM  PATIENT:  Thyra Breed  53 y.o. male  PRE-OPERATIVE DIAGNOSIS:  LEFT KNEE HYPERTROPHIC SYNOVITIS   POST-OPERATIVE DIAGNOSIS:  LEFT KNEE HYPERTROPHIC SYNOVITIS   PROCEDURE:  Procedure(s): LEFT KNEE ARTHROSCOPY WITH SYNOVECTOMY  (Left)  SURGEON:  Surgeon(s) and Role:    * Loanne Drilling, MD - Primary  PHYSICIAN ASSISTANT:   ASSISTANTS: none   ANESTHESIA:   general  EBL:     BLOOD ADMINISTERED:none  DRAINS: none   LOCAL MEDICATIONS USED:  MARCAINE     COUNTS:  YES  TOURNIQUET:    DICTATION: .Other Dictation: Dictation Number [id number]  PLAN OF CARE: Discharge to home after PACU  PATIENT DISPOSITION:  PACU - hemodynamically stable.

## 2014-02-01 NOTE — H&P (Signed)
  CC- Blake Mcmillan is a 53 y.o. male who presents with left knee pain.  HPI- . Knee Pain: Patient presents with knee pain involving the  left knee. Onset of the symptoms was several months ago. Inciting event: none known. Current symptoms include crepitus sensation, foreign body sensation and popping sensation. Pain is aggravated by going up and down stairs and rising after sitting.  Patient had a total kne arthroplasty in 12/12 and developed painful opping consistent with patellar clunk syndrome in the past year. prior knee problems. Evaluation to date: plain films: normal.   Past Medical History  Diagnosis Date  . Seasonal allergies 05-09-11    seasonal allergies with sinus issues  . GERD (gastroesophageal reflux disease) 05-09-11    tx. Pantoprazole  . Arthritis 05-09-11    osteoarthritis-all joints  . Depression 05-09-11    past hx. depression, none recent  . Dysrhythmia 05-09-11    epidoses of bradycardia, stress and other test negative-Lopressor d/c.  [name] Hypertension   . Hypertrophic synovitis     L KNEE    Past Surgical History  Procedure Laterality Date  . Sphincterotomy  05-09-11    '97  . Hemorrhoid surgery  05-09-11    '96  . Vasectomy  05-09-11    '07  . Nasal sinus surgery  05-09-11    2'12 [place]  . Carpal tunnel release  05-09-11    Bilaterally '02  . Total knee arthroplasty  05/12/2011    Procedure: TOTAL KNEE ARTHROPLASTY;  Surgeon: Loanne Drilling;  Location: [facility]S;  Service: Orthopedics;  Laterality: Left;  . Knee arthroscopy      RT KNEE    Prior to Admission medications   Medication Sig Start Date End Date Taking? Authorizing Provider  acetaminophen (TYLENOL) 500 MG tablet Take 1,500 mg by mouth every morning.    Historical Provider, MD  aspirin EC 81 MG tablet Take 81 mg by mouth every morning.    Historical Provider, MD  benazepril (LOTENSIN) 40 MG tablet Take 40 mg by mouth every morning.    Historical Provider, MD  Multiple Vitamin  (MULTIVITAMIN WITH MINERALS) TABS tablet Take 1 tablet by mouth every morning.    Historical Provider, MD  pantoprazole (PROTONIX) 40 MG tablet Take 40 mg by mouth every morning.     Historical Provider, MD  sodium chloride (OCEAN) 0.65 % nasal spray Place 2 sprays into the nose 2 (two) times daily.      Historical Provider, MD   KNEE EXAM No effusion, collateral ligaments intact, crepitus on range of motion, range 0-122  Physical Examination: General appearance - alert, well appearing, and in no distress Mental status - alert, oriented to person, place, and time Chest - clear to auscultation, no wheezes, rales or rhonchi, symmetric air entry Heart - normal rate, regular rhythm, normal S1, S2, no murmurs, rubs, clicks or gallops Abdomen - soft, nontender, nondistended, no masses or organomegaly Neurological - alert, oriented, normal speech, no focal findings or movement disorder noted   Asessment/Plan--- Left knee patellar clunk syndrome- - Plan left knee arthroscopy with synovectomy. Procedure risks and potential comps discussed with patient who elects to proceed. Goals are decreased pain and increased function with a high likelihood of achieving both

## 2014-02-01 NOTE — Discharge Instructions (Signed)
Arthroscopic Procedure, Knee °An arthroscopic procedure can find what is wrong with your knee. °PROCEDURE °Arthroscopy is a surgical technique that allows your orthopedic surgeon to diagnose and treat your knee injury with accuracy. They will look into your knee through a small instrument. This is almost like a small (pencil sized) telescope. Because arthroscopy affects your knee less than open knee surgery, you can anticipate a more rapid recovery. Taking an active role by following your caregiver's instructions will help with rapid and complete recovery. Use crutches, rest, elevation, ice, and knee exercises as instructed. The length of recovery depends on various factors including type of injury, age, physical condition, medical conditions, and your rehabilitation. °Your knee is the joint between the large bones (femur and tibia) in your leg. Cartilage covers these bone ends which are smooth and slippery and allow your knee to bend and move smoothly. Two menisci, thick, semi-lunar shaped pads of cartilage which form a rim inside the joint, help absorb shock and stabilize your knee. Ligaments bind the bones together and support your knee joint. Muscles move the joint, help support your knee, and take stress off the joint itself. Because of this all programs and physical therapy to rehabilitate an injured or repaired knee require rebuilding and strengthening your muscles. °AFTER THE PROCEDURE °· After the procedure, you will be moved to a recovery area until most of the effects of the medication have worn off. Your caregiver will discuss the test results with you.  °· Only take over-the-counter or prescription medicines for pain, discomfort, or fever as directed by your caregiver.  °SEEK MEDICAL CARE IF:  °· You have increased bleeding from your wounds.  °· You see redness, swelling, or have increasing pain in your wounds.  °· You have pus coming from your wound.  °· You have an oral temperature above 102° F (38.9°  C).  °· You notice a bad smell coming from the wound or dressing.  °· You have severe pain with any motion of your knee.  °SEEK IMMEDIATE MEDICAL CARE IF:  °· You develop a rash.  °· You have difficulty breathing.  °· You have any allergic problems.  °FURTHER INSTRUCTIONS: °· You may start showering two days after being discharged home but do not submerge the incisions under water.  °· Change dressing 48 hours after the procedure and then cover the small incisions with band aids until your follow up visit. °· Avoid periods of inactivity such as sitting longer than an hour when not asleep. This helps prevent blood clots.  °· You may put full weight on your legs and walk as much as is comfortable.  °· Do not drive while taking narcotics.  °Wear the elastic stockings for three weeks following surgery during the day but you may remove then at night. °· Make sure you keep all of your appointments after your operation with all of your doctors and caregivers. You should call the office at [phone] and make an appointment for approximately one week after the date of your surgery. °· Please pick up a stool softener and laxative for home use as long as you are requiring pain medications. °· Continue to use ice on the knee for pain and swelling from surgery. You may notice swelling that will progress down to the foot and ankle.  This is normal after surgery.  Elevate the leg when you are not up walking on it.   °RANGE OF MOTION AND STRENGTHENING EXERCISES  °Rehabilitation of the knee is   important following a knee injury or an operation. After just a few days of immobilization, the muscles of the thigh which control the knee become weakened and shrink (atrophy). Knee exercises are designed to build up the tone and strength of the thigh muscles and to improve knee motion. Often times heat used for twenty to thirty minutes before working out will loosen up your tissues and help with improving the range of motion but do not  use heat for the first two weeks following surgery. These exercises can be done on a training (exercise) mat, on the floor, on a table or on a bed. Use what ever works the best and is most comfortable for you Knee exercises include: ° ° ° ° ° ° °QUAD STRENGTHENING EXERCISES °Strengthening Quadriceps Sets ° °Tighten muscles on top of thigh by pushing knees down into floor or table. °Hold for 20 seconds. Repeat 10 times. °Do 2 sessions per day. ° ° ° °Strengthening Terminal Knee Extension ° °With knee bent over bolster, straighten knee by tightening muscle on top of thigh. Be sure to keep bottom of knee on bolster. °Hold for 20 seconds. Repeat 10 times. °Do 2 sessions per day. ° ° °Straight Leg with Bent Knee ° °Lie on back with opposite leg bent. Keep involved knee slightly bent at knee and raise leg 4-6". Hold for 10 seconds. °Repeat 20 times per set. °Do 2 sets per session. °Do 2 sessions per day. ° ° ° °General Anesthesia, Care After °Refer to this sheet in the next few weeks. These instructions provide you with information on caring for yourself after your procedure. Your health care provider may also give you more specific instructions. Your treatment has been planned according to current medical practices, but problems sometimes occur. Call your health care provider if you have any problems or questions after your procedure. °WHAT TO EXPECT AFTER THE PROCEDURE °After the procedure, it is typical to experience: °· Sleepiness. °· Nausea and vomiting. °HOME CARE INSTRUCTIONS °· For the first 24 hours after general anesthesia: °¨ Have a responsible person with you. °¨ Do not drive a car. If you are alone, do not take public transportation. °¨ Do not drink alcohol. °¨ Do not take medicine that has not been prescribed by your health care provider. °¨ Do not sign important papers or make important decisions. °¨ You may resume a normal diet and activities as directed by your health care provider. °· Change bandages  (dressings) as directed. °· If you have questions or problems that seem related to general anesthesia, call the hospital and ask for the anesthetist or anesthesiologist on call. °SEEK MEDICAL CARE IF: °· You have nausea and vomiting that continue the day after anesthesia. °· You develop a rash. °SEEK IMMEDIATE MEDICAL CARE IF:  °· You have difficulty breathing. °· You have chest pain. °· You have any allergic problems. °Document Released: 08/18/2000 Document Revised: 05/17/2013 Document Reviewed: 11/25/2012 °Exit[employer]® Patient Information ©2015 [employer]. This information is not intended to replace advice given to you by your health care provider. Make sure you discuss any questions you have with your health care provider. ° °

## 2014-02-01 NOTE — Anesthesia Preprocedure Evaluation (Addendum)
Anesthesia Evaluation  Patient identified by MRN, date of birth, ID band Patient awake    Reviewed: Allergy & Precautions, H&P , NPO status , Patient's Chart, lab work & pertinent test results  Airway Mallampati: II TM Distance: >3 FB Neck ROM: Full    Dental no notable dental hx.    Pulmonary former smoker,  breath sounds clear to auscultation  Pulmonary exam normal       Cardiovascular hypertension, Pt. on medications - dysrhythmias Rhythm:Regular Rate:Normal     Neuro/Psych negative neurological ROS  negative psych ROS   GI/Hepatic Neg liver ROS, GERD-  Medicated and Controlled,  Endo/Other  negative endocrine ROS  Renal/GU negative Renal ROS  negative genitourinary   Musculoskeletal negative musculoskeletal ROS (+)   Abdominal   Peds negative pediatric ROS (+)  Hematology negative hematology ROS (+)   Anesthesia Other Findings   Reproductive/Obstetrics negative OB ROS                          Anesthesia Physical Anesthesia Plan  ASA: II  Anesthesia Plan: General   Post-op Pain Management:    Induction: Intravenous  Airway Management Planned: LMA  Additional Equipment:   Intra-op Plan:   Post-operative Plan: Extubation in OR  Informed Consent: I have reviewed the patients History and Physical, chart, labs and discussed the procedure including the risks, benefits and alternatives for the proposed anesthesia with the patient or authorized representative who has indicated his/her understanding and acceptance.   Dental advisory given  Plan Discussed with: CRNA  Anesthesia Plan Comments:         Anesthesia Quick Evaluation

## 2014-02-01 NOTE — Op Note (Signed)
NAME:  WIL, SLAPE NO.:  [id number]  MEDICAL RECORD NO.:  [id number]  LOCATION:  [facility]                         FACILITY:  [facility]  PHYSICIAN:  Ollen Gross, M.D.    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  PROCEDURE:  Left knee arthroscopy with synovectomy.  SURGEON:  Ollen Gross, M.D.  ASSISTANT:  No assistant.  ANESTHESIA:  General.  ESTIMATED BLOOD LOSS:  Minimal.  DRAINS:  None.  COMPLICATIONS:  None.  CONDITION:  Stable to recovery.  BRIEF CLINICAL NOTE:  Hassen is a 53 year old male, had a left total knee arthroplasty done in December 2012.  He has recently developed painful popping in the knee consistent with patellar clunk syndrome.  He presents now for arthroscopy and synovectomy.  PROCEDURE IN DETAIL:  After successful administration of general anesthetic, a tourniquet was placed high on his left thigh and his left lower extremity was prepped and draped in the usual sterile fashion. Standard superomedial and inferolateral incisions were made.  Inflow cannula passed superomedial.  Camera passed inferolateral.  Arthroscopic visualization proceeds.  There was a large amount of hypertrophic tissue present at the junction between the quadriceps tendon and the patella. It essentially obliterated the suprapatellar pouch.  A superolateral portal was then created with an 11 blade, and using combination of a shaver and the ArthroCare device, this tissue was debrided back to normal-appearing tissue recreating the suprapatellar pouch.  There was also some tissue in the medial and lateral gutters, which was debrided. The joint was then inspected, no other abnormal tissue was noted.   All the gutters and the suprapatellar pouch had been restored.  The arthroscopic equipments were removed from the lateral portals, which were closed with interrupted 4-0 nylon.  A 20 mL of 0.25% Marcaine with epinephrine was injected through the inflow cannula and that was removed and that portal was closed with nylon.  A bulky sterile dressing was then applied and he was awakened and transported to recovery in stable condition.     Ollen Gross, M.D.     FA/MEDQ  D:  02/01/2014  T:  02/01/2014  Job:  [id number]

## 2014-02-01 NOTE — Transfer of Care (Signed)
Immediate Anesthesia Transfer of Care Note  Patient: Blake Mcmillan  Procedure(s) Performed: Procedure(s): LEFT KNEE ARTHROSCOPY WITH SYNOVECTOMY  (Left)  Patient Location: PACU  Anesthesia Type:General  Level of Consciousness: awake, alert , oriented and patient cooperative  Airway & Oxygen Therapy: Patient Spontanous Breathing and Patient connected to face mask oxygen  Post-op Assessment: Report given to PACU RN, Post -op Vital signs reviewed and stable and Patient moving all extremities  Post vital signs: Reviewed and stable  Complications: No apparent anesthesia complications

## 2014-02-02 ENCOUNTER — Encounter (HOSPITAL_COMMUNITY): Payer: Self-pay | Admitting: Orthopedic Surgery

## 2015-01-02 IMAGING — CR DG CHEST 2V
2 series · 2 of 2 positions shown · non-contrast
Comparison: None.

CLINICAL DATA: Preoperative exam prior to knee arthroscopy. History
of hypertension. Previous smoker for 17 years.

EXAM:
CHEST  2 VIEW

[w chest pa]
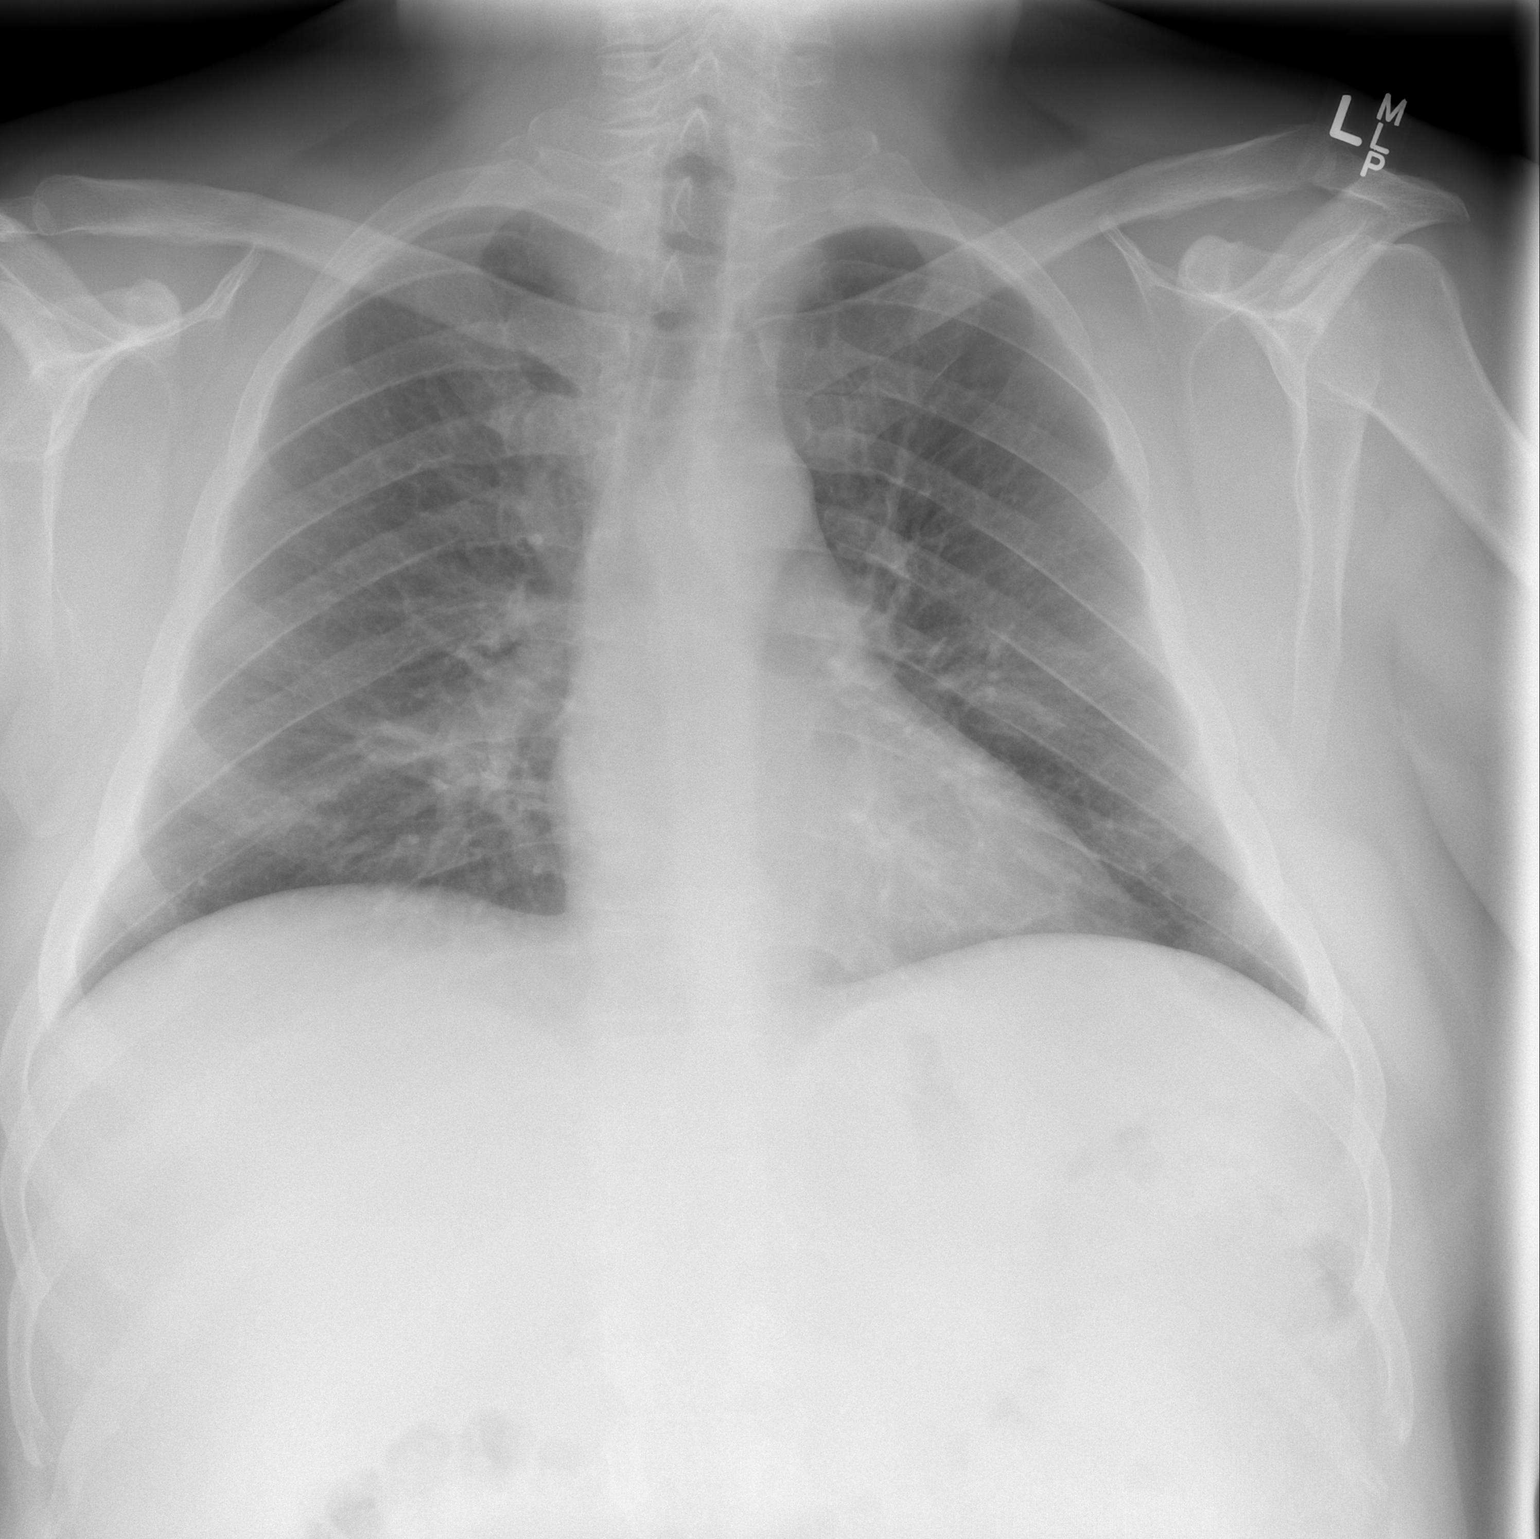

[w chest lat]
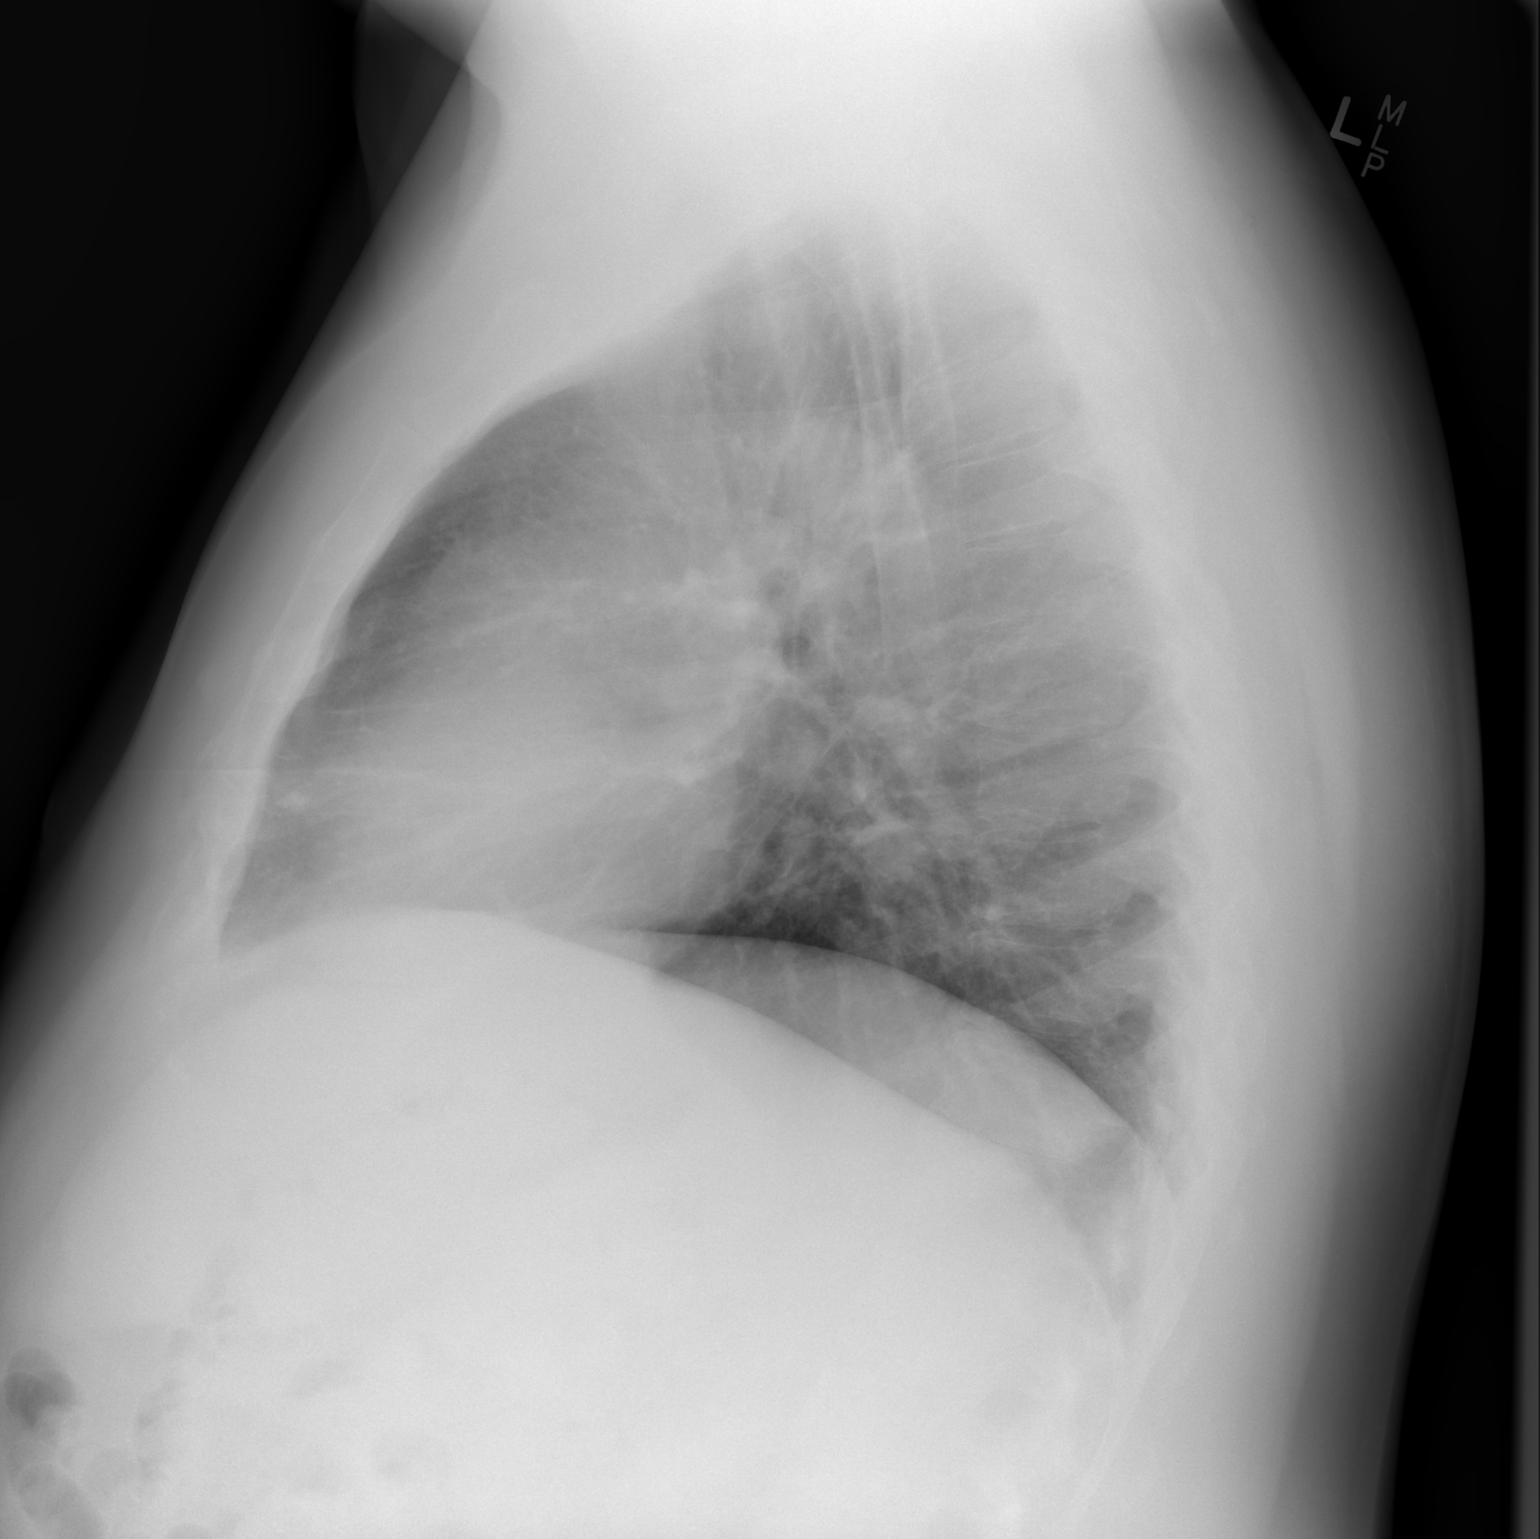

[2 of 2 positions shown; findings below may reference images not displayed]

FINDINGS: The heart size and mediastinal contours are within normal limits.
Both lungs are clear. The visualized skeletal structures are
unremarkable.
IMPRESSION: No active cardiopulmonary disease.

## 2016-08-21 ENCOUNTER — Encounter: Payer: Self-pay | Admitting: *Deleted

## 2016-08-21 ENCOUNTER — Other Ambulatory Visit: Payer: Self-pay | Admitting: Cardiovascular Disease

## 2016-08-21 ENCOUNTER — Encounter: Payer: Self-pay | Admitting: Cardiovascular Disease

## 2016-08-21 ENCOUNTER — Telehealth: Payer: Self-pay | Admitting: Cardiovascular Disease

## 2016-08-21 ENCOUNTER — Ambulatory Visit (INDEPENDENT_AMBULATORY_CARE_PROVIDER_SITE_OTHER): Payer: PRIVATE HEALTH INSURANCE | Admitting: Cardiovascular Disease

## 2016-08-21 VITALS — BP 132/90 | HR 69 | Ht 70.0 in | Wt 266.0 lb

## 2016-08-21 DIAGNOSIS — I209 Angina pectoris, unspecified: Secondary | ICD-10-CM

## 2016-08-21 DIAGNOSIS — I10 Essential (primary) hypertension: Secondary | ICD-10-CM | POA: Diagnosis not present

## 2016-08-21 DIAGNOSIS — R9439 Abnormal result of other cardiovascular function study: Secondary | ICD-10-CM

## 2016-08-21 DIAGNOSIS — Z9289 Personal history of other medical treatment: Secondary | ICD-10-CM | POA: Diagnosis not present

## 2016-08-21 DIAGNOSIS — R079 Chest pain, unspecified: Secondary | ICD-10-CM

## 2016-08-21 NOTE — Telephone Encounter (Signed)
Pre-cert Verification for the following procedure   Left heart cath - Dr. Okey DupreEnd - tomorrow at 1:30

## 2016-08-21 NOTE — Progress Notes (Signed)
     CARDIOLOGY CONSULT NOTE  Patient ID: Blake Mcmillan MRN: [id number] DOB/AGE: 02/22/1961 56 y.o.  Admit date: (Not on file) Primary Physician: XAJE A HASANAJ, MD Referring Physician: Hasanaj  Reason for Consultation: chest pain, abnormal stress test  HPI: The patient is a 56-year-old male who has been referred by his PCP for the evaluation of chest pain and an abnormal stress test.  His PCP called me earlier this morning to discuss the situation.   He has a history of hypertension. He was recently hospitalized at [facility] for chest pain and ruled out for an acute coronary syndrome. He subsequently underwent nuclear stress testing on 08/20/16. I reviewed all hospital and office records.  Stress test demonstrated "a large area of reversibility involving the septal myocardium concerning for ischemia. Normal left ventricular wall motion. Left ventricular ejection fraction 54%. High risk study." He reportedly exercised and achieved 10.1 METS for 7 minutes, 27 seconds. The stress ECG was reportedly negative for ischemia.  Discharge summary mentions d-dimer was mildly elevated. Other labs showed sodium 138, potassium 3.9, BUN 13, creatinine 0.87, white blood cell 11.5, hemoglobin 16.1.  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 He thought it was due to stress and the chest pains eventually resolved. However over the past few months and more so in the past 2 weeks, he has had severe retrosternal chest pain described as sharp.  He has been experiencing more chest pain with exertion when he was building a fence, splitting a telephone pole, and lifting hay. He has had some associated shortness of breath and occasional palpitations.  His wife is a nurse and has checked his blood pressure at times and it has been as high as 210/140. He has been taking benazepril for this for the past 5 days. He has  had associated headaches and mild blurriness of vision.  He denies orthopnea, leg edema, and paroxysmal nocturnal dyspnea. He sleeps with 2 pillows for comfort.  He has a history of ventricular bigeminy dating back to when he was 56 years old and also had an episode of syncope. He was reportedly bradycardic down to the 20s.  I have ordered a copy of his ECG for personal review.  HbA1c one year ago was 6.5%.   Family history: Brother had MI at age 60. Father had CABG at age 78.    No Known Allergies  Current Outpatient Prescriptions  Medication Sig Dispense Refill  . aspirin EC 81 MG tablet Take 81 mg by mouth every morning.    . benazepril (LOTENSIN) 40 MG tablet Take 40 mg by mouth every morning.    . diphenhydrAMINE (BENADRYL) 50 MG capsule Take 50 mg by mouth at bedtime as needed.    . naproxen sodium (ANAPROX) 220 MG tablet Take 220 mg by mouth every morning.     No current facility-administered medications for this visit.     Past Medical History:  Diagnosis Date  . Arthritis 05-09-11   osteoarthritis-all joints  . Depression 05-09-11   past hx. depression, none recent  . Dysrhythmia 05-09-11   epidoses of bradycardia, stress and other test negative-Lopressor d/c.  . GERD (gastroesophageal reflux disease) 05-09-11   tx. Pantoprazole  . Hypertension   . Hypertrophic synovitis    L KNEE  . Seasonal allergies 05-09-11   seasonal allergies with sinus issues    Past Surgical History:  Procedure Laterality   Date  . CARPAL TUNNEL RELEASE  05-09-11   Bilaterally '02  . HEMORRHOID SURGERY  05-09-11   '96  . KNEE ARTHROSCOPY     RT KNEE  . KNEE ARTHROSCOPY Left 02/01/2014   Procedure: LEFT KNEE ARTHROSCOPY WITH SYNOVECTOMY ;  Surgeon: Frank [name] V, MD;  Location: [facility];  Service: Orthopedics;  Laterality: Left;  . NASAL SINUS SURGERY  05-09-11   2'[address] [place]  . SPHINCTEROTOMY  05-09-11   '97  . TOTAL KNEE ARTHROPLASTY  05/12/2011   Procedure: TOTAL KNEE  ARTHROPLASTY;  Surgeon: Frank V [name];  Location: [facility];  Service: Orthopedics;  Laterality: Left;  . VASECTOMY  05-09-11   '07    Social History   Social History  . Marital status: Married    Spouse name: N/A  . Number of children: N/A  . Years of education: N/A   Occupational History  . Not on file.   Social History Main Topics  . Smoking status: Former Smoker    Quit date: 06/08/1992  . Smokeless tobacco: Never Used  . Alcohol use No  . Drug use: No  . Sexual activity: Yes   Other Topics Concern  . Not on file   Social History Narrative  . No narrative on file      Prior to Admission medications   Medication Sig Start Date End Date Taking? Authorizing Provider  aspirin EC 81 MG tablet Take 81 mg by mouth every morning.   Yes Historical Provider, MD  benazepril (LOTENSIN) 40 MG tablet Take 40 mg by mouth every morning.   Yes Historical Provider, MD  diphenhydrAMINE (BENADRYL) 50 MG capsule Take 50 mg by mouth at bedtime as needed.   Yes Historical Provider, MD  naproxen sodium (ANAPROX) 220 MG tablet Take 220 mg by mouth every morning.   Yes Historical Provider, MD     Review of systems complete and found to be negative unless listed above in HPI     Physical exam Blood pressure 132/90, pulse 69, height 5' 10" (1.778 m), weight 266 lb (120.7 kg), SpO2 96 %. General: NAD Neck: No JVD, no thyromegaly or thyroid nodule.  Lungs: Clear to auscultation bilaterally with normal respiratory effort. CV: Nondisplaced PMI. Regular rate and rhythm, normal S1/S2, no S3/S4, no murmur.  No peripheral edema.  No carotid bruit.    Abdomen: Soft, nontender, obese.  Skin: Intact without lesions or rashes.  Neurologic: Alert and oriented x 3.  Psych: Normal affect. Extremities: No clubbing or cyanosis.  HEENT: Normal.   ECG: Most recent ECG reviewed.  Telemetry: Independently reviewed.  Labs:   Lab Results  Component Value Date   WBC 11.5 (H) 01/27/2014   HGB 16.5  01/27/2014   HCT 46.5 01/27/2014   MCV 89.6 01/27/2014   PLT 276 01/27/2014   No results for input(s): NA, K, CL, CO2, BUN, CREATININE, CALCIUM, PROT, BILITOT, ALKPHOS, ALT, AST, GLUCOSE in the last 168 hours.  Invalid input(s): LABALBU No results found for: CKTOTAL, CKMB, CKMBINDEX, TROPONINI No results found for: CHOL No results found for: HDL No results found for: LDLCALC No results found for: TRIG No results found for: CHOLHDL No results found for: LDLDIRECT       Studies: No results found.  ASSESSMENT AND PLAN:  1. Angina pectoris with high risk abnormal nuclear stress test as detailed above: Given the progression of symptoms and high risk nuclear stress test, I will arrange for coronary angiography. He is now taking aspirin 81 mg   daily and has nitroglycerin spray to be used as needed. Continue benazepril for blood pressure control.  2. Malignant hypertension: Blood pressure is reasonably controlled on benazepril 40 mg daily. I will continue to monitor this.  Dispo: fu 1 month.   Signed: Xochitl Egle, M.D., F.A.C.C.  08/21/2016, 12:28 PM 

## 2016-08-21 NOTE — Patient Instructions (Signed)
Medication Instructions:  Continue all current medications.  Labwork: none  Testing/Procedures: Your physician has requested that you have a cardiac catheterization. Cardiac catheterization is used to diagnose and/or treat various heart conditions. Doctors may recommend this procedure for a number of different reasons. The most common reason is to evaluate chest pain. Chest pain can be a symptom of coronary artery disease (CAD), and cardiac catheterization can show whether plaque is narrowing or blocking your heart's arteries. This procedure is also used to evaluate the valves, as well as measure the blood flow and oxygen levels in different parts of your heart. For further information please visit www.cardiosmart.org. Please follow instruction sheet, as given.  Follow-Up: 1 month   Any Other Special Instructions Will Be Listed Below (If Applicable).  If you need a refill on your cardiac medications before your next appointment, please call your pharmacy.  

## 2016-08-22 ENCOUNTER — Encounter (HOSPITAL_COMMUNITY)
Admission: RE | Disposition: A | Discharge status: Home / Self Care | Payer: Self-pay | Source: Ambulatory Visit | Attending: Internal Medicine

## 2016-08-22 ENCOUNTER — Ambulatory Visit (HOSPITAL_COMMUNITY)
Admission: RE | Admit: 2016-08-22 | Discharge: 2016-08-22 | Disposition: A | Discharge status: Home / Self Care | Payer: PRIVATE HEALTH INSURANCE | Source: Ambulatory Visit | Attending: Internal Medicine | Admitting: Internal Medicine

## 2016-08-22 DIAGNOSIS — M1990 Unspecified osteoarthritis, unspecified site: Secondary | ICD-10-CM | POA: Insufficient documentation

## 2016-08-22 DIAGNOSIS — K219 Gastro-esophageal reflux disease without esophagitis: Secondary | ICD-10-CM | POA: Insufficient documentation

## 2016-08-22 DIAGNOSIS — I200 Unstable angina: Secondary | ICD-10-CM | POA: Diagnosis present

## 2016-08-22 DIAGNOSIS — I25110 Atherosclerotic heart disease of native coronary artery with unstable angina pectoris: Secondary | ICD-10-CM | POA: Insufficient documentation

## 2016-08-22 DIAGNOSIS — R9439 Abnormal result of other cardiovascular function study: Secondary | ICD-10-CM | POA: Diagnosis present

## 2016-08-22 DIAGNOSIS — Z7982 Long term (current) use of aspirin: Secondary | ICD-10-CM | POA: Diagnosis not present

## 2016-08-22 DIAGNOSIS — I10 Essential (primary) hypertension: Secondary | ICD-10-CM | POA: Insufficient documentation

## 2016-08-22 DIAGNOSIS — Z87891 Personal history of nicotine dependence: Secondary | ICD-10-CM | POA: Insufficient documentation

## 2016-08-22 DIAGNOSIS — Z8249 Family history of ischemic heart disease and other diseases of the circulatory system: Secondary | ICD-10-CM | POA: Diagnosis not present

## 2016-08-22 DIAGNOSIS — R079 Chest pain, unspecified: Secondary | ICD-10-CM

## 2016-08-22 HISTORY — PX: LEFT HEART CATH AND CORONARY ANGIOGRAPHY: CATH118249

## 2016-08-22 LAB — CBC
HCT: 47.7 % (ref 39.0–52.0)
Hemoglobin: 16.6 g/dL (ref 13.0–17.0)
MCH: 30.6 pg (ref 26.0–34.0)
MCHC: 34.8 g/dL (ref 30.0–36.0)
MCV: 87.8 fL (ref 78.0–100.0)
PLATELETS: 262 10*3/uL (ref 150–400)
RBC: 5.43 MIL/uL (ref 4.22–5.81)
RDW: 13.6 % (ref 11.5–15.5)
WBC: 10.2 10*3/uL (ref 4.0–10.5)

## 2016-08-22 LAB — BASIC METABOLIC PANEL
Anion gap: 8 (ref 5–15)
BUN: 10 mg/dL (ref 6–20)
CALCIUM: 9.1 mg/dL (ref 8.9–10.3)
CO2: 24 mmol/L (ref 22–32)
CREATININE: 0.81 mg/dL (ref 0.61–1.24)
Chloride: 104 mmol/L (ref 101–111)
GFR calc Af Amer: >60 mL/min (ref >60)
GLUCOSE: 80 mg/dL (ref 65–99)
Potassium: 4 mmol/L (ref 3.5–5.1)
SODIUM: 136 mmol/L (ref 135–145)

## 2016-08-22 LAB — PROTIME-INR
INR: 1.06
PROTHROMBIN TIME: 13.9 s (ref 11.4–15.2)

## 2016-08-22 SURGERY — LEFT HEART CATH AND CORONARY ANGIOGRAPHY
Anesthesia: LOCAL

## 2016-08-22 MED ORDER — ISOSORBIDE MONONITRATE ER 30 MG PO TB24: 30.0000 mg | ORAL_TABLET | Freq: Every day | ORAL | 5 refills | Status: DC

## 2016-08-22 MED ORDER — MIDAZOLAM HCL 2 MG/2ML IJ SOLN
INTRAMUSCULAR | Status: AC
  Filled 2016-08-22: qty 2

## 2016-08-22 MED ORDER — IOPAMIDOL (ISOVUE-370) INJECTION 76%
INTRAVENOUS | Status: DC | PRN
  Administered 2016-08-22: 80 mL via INTRA_ARTERIAL

## 2016-08-22 MED ORDER — HEPARIN SODIUM (PORCINE) 1000 UNIT/ML IJ SOLN
INTRAMUSCULAR | Status: DC | PRN
  Administered 2016-08-22: 5000 [IU] via INTRAVENOUS

## 2016-08-22 MED ORDER — SODIUM CHLORIDE 0.9% FLUSH: 3.0000 mL | Freq: Two times a day (BID) | INTRAVENOUS | Status: DC

## 2016-08-22 MED ORDER — SODIUM CHLORIDE 0.9 % IV SOLN: 250.0000 mL | INTRAVENOUS | Status: DC | PRN

## 2016-08-22 MED ORDER — SODIUM CHLORIDE 0.9% FLUSH: 3.0000 mL | INTRAVENOUS | Status: DC | PRN

## 2016-08-22 MED ORDER — SODIUM CHLORIDE 0.9 % WEIGHT BASED INFUSION
3.0000 mL/kg/h | INTRAVENOUS | Status: DC
  Administered 2016-08-22: 3 mL/kg/h via INTRAVENOUS

## 2016-08-22 MED ORDER — FENTANYL CITRATE (PF) 100 MCG/2ML IJ SOLN
INTRAMUSCULAR | Status: AC
  Filled 2016-08-22: qty 2

## 2016-08-22 MED ORDER — HEPARIN (PORCINE) IN NACL 2-0.9 UNIT/ML-% IJ SOLN
INTRAMUSCULAR | Status: DC | PRN
  Administered 2016-08-22: 14:00:00 via INTRA_ARTERIAL

## 2016-08-22 MED ORDER — LIDOCAINE HCL (PF) 1 % IJ SOLN
INTRAMUSCULAR | Status: AC
  Filled 2016-08-22: qty 30

## 2016-08-22 MED ORDER — VERAPAMIL HCL 2.5 MG/ML IV SOLN
INTRAVENOUS | Status: AC
  Filled 2016-08-22: qty 2

## 2016-08-22 MED ORDER — SODIUM CHLORIDE 0.9 % WEIGHT BASED INFUSION: 1.0000 mL/kg/h | INTRAVENOUS | Status: DC

## 2016-08-22 MED ORDER — SODIUM CHLORIDE 0.9 % IV SOLN
INTRAVENOUS | Status: DC
Start: 2016-08-22 — End: 2016-08-22

## 2016-08-22 MED ORDER — SODIUM CHLORIDE 0.9% FLUSH
3.0000 mL | Freq: Two times a day (BID) | INTRAVENOUS | Status: DC
Start: 2016-08-22 — End: 2016-08-22

## 2016-08-22 MED ORDER — FENTANYL CITRATE (PF) 100 MCG/2ML IJ SOLN
INTRAMUSCULAR | Status: DC | PRN
  Administered 2016-08-22: 50 ug via INTRAVENOUS

## 2016-08-22 MED ORDER — LIDOCAINE HCL (PF) 1 % IJ SOLN
INTRAMUSCULAR | Status: DC | PRN
  Administered 2016-08-22: 1 mL via INTRADERMAL

## 2016-08-22 MED ORDER — MIDAZOLAM HCL 2 MG/2ML IJ SOLN
INTRAMUSCULAR | Status: DC | PRN
  Administered 2016-08-22: 1 mg via INTRAVENOUS

## 2016-08-22 MED ORDER — HEPARIN (PORCINE) IN NACL 2-0.9 UNIT/ML-% IJ SOLN
INTRAMUSCULAR | Status: AC
  Filled 2016-08-22: qty 1000

## 2016-08-22 MED ORDER — HEPARIN (PORCINE) IN NACL 2-0.9 UNIT/ML-% IJ SOLN
INTRAMUSCULAR | Status: DC | PRN
  Administered 2016-08-22: 1000 mL via INTRA_ARTERIAL

## 2016-08-22 MED ORDER — ASPIRIN 81 MG PO CHEW: 81.0000 mg | CHEWABLE_TABLET | ORAL | Status: DC

## 2016-08-22 MED ORDER — IOPAMIDOL (ISOVUE-370) INJECTION 76%
INTRAVENOUS | Status: AC
  Filled 2016-08-22: qty 100

## 2016-08-22 SURGICAL SUPPLY — 10 items

## 2016-08-22 NOTE — H&P (View-Only) (Signed)
CARDIOLOGY CONSULT NOTE  Patient ID: Blake Mcmillan MRN: [id number] DOB/AGE: [birth year] 57 y.o.  Admit date: (Not on file) Primary Physician: Toma Deiters, MD Referring Physician: Olena Leatherwood  Reason for Consultation: chest pain, abnormal stress test  HPI: The patient is a 56 year old male who has been referred by his PCP for the evaluation of chest pain and an abnormal stress test.  His PCP called me earlier this morning to discuss the situation.   He has a history of hypertension. He was recently hospitalized at [facility] for chest pain and ruled out for an acute coronary syndrome. He subsequently underwent nuclear stress testing on 08/20/16. I reviewed all hospital and office records.  Stress test demonstrated "a large area of reversibility involving the septal myocardium concerning for ischemia. Normal left ventricular wall motion. Left ventricular ejection fraction 54%. High risk study." He reportedly exercised and achieved 10.1 METS for 7 minutes, 27 seconds. The stress ECG was reportedly negative for ischemia.  Discharge summary mentions d-dimer was mildly elevated. Other labs showed sodium 138, potassium 3.9, BUN 13, creatinine 0.87, white blood cell 11.5, hemoglobin 16.1.  With regards to his history of chest pain, he initially began experiencing a back in 2016 when he retired from [employer]. He thought it was due to stress and the chest pains eventually resolved. However over the past few months and more so in the past 2 weeks, he has had severe retrosternal chest pain described as sharp.  He has been experiencing more chest pain with exertion when he was building a fence, splitting a telephone pole, and lifting hay. He has had some associated shortness of breath and occasional palpitations.  His wife is a Engineer, civil (consulting) and has checked his blood pressure at times and it has been as high as 210/140. He has been taking benazepril for this for the past 5 days. He has  had associated headaches and mild blurriness of vision.  He denies orthopnea, leg edema, and paroxysmal nocturnal dyspnea. He sleeps with 2 pillows for comfort.  He has a history of ventricular bigeminy dating back to when he was 56 years old and also had an episode of syncope. He was reportedly bradycardic down to the 20s.  I have ordered a copy of his ECG for personal review.  HbA1c one year ago was 6.5%.   Family history: Brother had MI at age 87. Father had CABG at age 75.    No Known Allergies  Current Outpatient Prescriptions  Medication Sig Dispense Refill  . aspirin EC 81 MG tablet Take 81 mg by mouth every morning.    . benazepril (LOTENSIN) 40 MG tablet Take 40 mg by mouth every morning.    . diphenhydrAMINE (BENADRYL) 50 MG capsule Take 50 mg by mouth at bedtime as needed.    . naproxen sodium (ANAPROX) 220 MG tablet Take 220 mg by mouth every morning.     No current facility-administered medications for this visit.     Past Medical History:  Diagnosis Date  . Arthritis 05-09-11   osteoarthritis-all joints  . Depression 05-09-11   past hx. depression, none recent  . Dysrhythmia 05-09-11   epidoses of bradycardia, stress and other test negative-Lopressor d/c.  [name] GERD (gastroesophageal reflux disease) 05-09-11   tx. Pantoprazole  . Hypertension   . Hypertrophic synovitis    L KNEE  . Seasonal allergies 05-09-11   seasonal allergies with sinus issues    Past Surgical History:  Procedure Laterality  Date  . CARPAL TUNNEL RELEASE  05-09-11   Bilaterally '02  . HEMORRHOID SURGERY  05-09-11   '96  . KNEE ARTHROSCOPY     RT KNEE  . KNEE ARTHROSCOPY Left 02/01/2014   Procedure: LEFT KNEE ARTHROSCOPY WITH SYNOVECTOMY ;  Surgeon: Loanne Drilling, MD;  Location: [facility];  Service: Orthopedics;  Laterality: Left;  . NASAL SINUS SURGERY  05-09-11   2'[address] [facility]  . SPHINCTEROTOMY  05-09-11   '97  . TOTAL KNEE ARTHROPLASTY  05/12/2011   Procedure: TOTAL KNEE  ARTHROPLASTY;  Surgeon: Loanne Drilling;  Location: [facility];  Service: Orthopedics;  Laterality: Left;  [name] VASECTOMY  05-09-11   '07    Social History   Social History  . Marital status: Married    Spouse name: N/A  . Number of children: N/A  . Years of education: N/A   Occupational History  . Not on file.   Social History Main Topics  . Smoking status: Former Smoker    Quit date: 06/08/1992  . Smokeless tobacco: Never Used  . Alcohol use No  . Drug use: No  . Sexual activity: Yes   Other Topics Concern  . Not on file   Social History Narrative  . No narrative on file      Prior to Admission medications   Medication Sig Start Date End Date Taking? Authorizing Provider  aspirin EC 81 MG tablet Take 81 mg by mouth every morning.   Yes Historical Provider, MD  benazepril (LOTENSIN) 40 MG tablet Take 40 mg by mouth every morning.   Yes Historical Provider, MD  diphenhydrAMINE (BENADRYL) 50 MG capsule Take 50 mg by mouth at bedtime as needed.   Yes Historical Provider, MD  naproxen sodium (ANAPROX) 220 MG tablet Take 220 mg by mouth every morning.   Yes Historical Provider, MD     Review of systems complete and found to be negative unless listed above in HPI     Physical exam Blood pressure 132/90, pulse 69, height  (1.778 m), weight 266 lb (120.7 kg), SpO2 96 %. General: NAD Neck: No JVD, no thyromegaly or thyroid nodule.  Lungs: Clear to auscultation bilaterally with normal respiratory effort. CV: Nondisplaced PMI. Regular rate and rhythm, normal S1/S2, no S3/S4, no murmur.  No peripheral edema.  No carotid bruit.    Abdomen: Soft, nontender, obese.  Skin: Intact without lesions or rashes.  Neurologic: Alert and oriented x 3.  Psych: Normal affect. Extremities: No clubbing or cyanosis.  HEENT: Normal.   ECG: Most recent ECG reviewed.  Telemetry: Independently reviewed.  Labs:   Lab Results  Component Value Date   WBC 11.5 (H) 01/27/2014   HGB 16.5  01/27/2014   HCT 46.5 01/27/2014   MCV 89.6 01/27/2014   PLT 276 01/27/2014   No results for input(s): NA, K, CL, CO2, BUN, CREATININE, CALCIUM, PROT, BILITOT, ALKPHOS, ALT, AST, GLUCOSE in the last 168 hours.  Invalid input(s): LABALBU No results found for: CKTOTAL, CKMB, CKMBINDEX, TROPONINI No results found for: CHOL No results found for: HDL No results found for: LDLCALC No results found for: TRIG No results found for: CHOLHDL No results found for: LDLDIRECT       Studies: No results found.  ASSESSMENT AND PLAN:  1. Angina pectoris with high risk abnormal nuclear stress test as detailed above: Given the progression of symptoms and high risk nuclear stress test, I will arrange for coronary angiography. He is now taking aspirin 81 mg  daily and has nitroglycerin spray to be used as needed. Continue benazepril for blood pressure control.  2. Malignant hypertension: Blood pressure is reasonably controlled on benazepril 40 mg daily. I will continue to monitor this.  Dispo: fu 1 month.   Signed: Prentice Docker, M.D., F.A.C.C.  08/21/2016, 12:28 PM

## 2016-08-22 NOTE — Discharge Instructions (Signed)
Angiogram, Care After °This sheet gives you information about how to care for yourself after your procedure. Your health care provider may also give you more specific instructions. If you have problems or questions, contact your health care provider. °What can I expect after the procedure? °After the procedure, it is common to have bruising and tenderness at the catheter insertion area. °Follow these instructions at home: °Insertion site care  °· Follow instructions from your health care provider about how to take care of your insertion site. Make sure you: °¨ Wash your hands with soap and water before you change your bandage (dressing). If soap and water are not available, use hand sanitizer. °¨ Change your dressing as told by your health care provider. °¨ Leave stitches (sutures), skin glue, or adhesive strips in place. These skin closures may need to stay in place for 2 weeks or longer. If adhesive strip edges start to loosen and curl up, you may trim the loose edges. Do not remove adhesive strips completely unless your health care provider tells you to do that. °· Do not take baths, swim, or use a hot tub until your health care provider approves. °· You may shower 24-48 hours after the procedure or as told by your health care provider. °¨ Gently wash the site with plain soap and water. °¨ Pat the area dry with a clean towel. °¨ Do not rub the site. This may cause bleeding. °· Do not apply powder or lotion to the site. Keep the site clean and dry. °· Check your insertion site every day for signs of infection. Check for: °¨ Redness, swelling, or pain. °¨ Fluid or blood. °¨ Warmth. °¨ Pus or a bad smell. °Activity  °· Rest as told by your health care provider, usually for 1-2 days. °· Do not lift anything that is heavier than 10 lbs. (4.5 kg) or as told by your health care provider. °· Do not drive for 24 hours if you were given a medicine to help you relax (sedative). °· Do not drive or use heavy machinery while  taking prescription pain medicine. °General instructions  °· Return to your normal activities as told by your health care provider, usually in about a week. Ask your health care provider what activities are safe for you. °· If the catheter site starts bleeding, lie flat and put pressure on the site. If the bleeding does not stop, get help right away. This is a medical emergency. °· Drink enough fluid to keep your urine clear or pale yellow. This helps flush the contrast dye from your body. °· Take over-the-counter and prescription medicines only as told by your health care provider. °· Keep all follow-up visits as told by your health care provider. This is important. °Contact a health care provider if: °· You have a fever or chills. °· You have redness, swelling, or pain around your insertion site. °· You have fluid or blood coming from your insertion site. °· The insertion site feels warm to the touch. °· You have pus or a bad smell coming from your insertion site. °· You have bruising around the insertion site. °· You notice blood collecting in the tissue around the catheter site (hematoma). The hematoma may be painful to the touch. °Get help right away if: °· You have severe pain at the catheter insertion area. °· The catheter insertion area swells very fast. °· The catheter insertion area is bleeding, and the bleeding does not stop when you hold steady pressure on   the area. °· The area near or just beyond the catheter insertion site becomes pale, cool, tingly, or numb. °These symptoms may represent a serious problem that is an emergency. Do not wait to see if the symptoms will go away. Get medical help right away. Call your local emergency services (911 in the [place] Do not drive yourself to the hospital. °Summary °· After the procedure, it is common to have bruising and tenderness at the catheter insertion area. °· After the procedure, it is important to rest and drink plenty of fluids. °· Do not take baths,  swim, or use a hot tub until your health care provider says it is okay to do so. You may shower 24-48 hours after the procedure or as told by your health care provider. °· If the catheter site starts bleeding, lie flat and put pressure on the site. If the bleeding does not stop, get help right away. This is a medical emergency. °This information is not intended to replace advice given to you by your health care provider. Make sure you discuss any questions you have with your health care provider. °Document Released: 11/28/2004 Document Revised: 04/16/2016 Document Reviewed: 04/16/2016 °Elsevier Interactive Patient Education © 2017 [employer] ° °

## 2016-08-22 NOTE — Interval H&P Note (Signed)
History and Physical Interval Note:  08/22/2016 2:05 PM  Blake Mcmillan  has presented today for cardiac catheterization, with the diagnosis of unstable angina and abnormal stress test. The various methods of treatment have been discussed with the patient and family. After consideration of risks, benefits and other options for treatment, the patient has consented to  Procedure(s): Left Heart Cath and Coronary Angiography (N/A) as a surgical intervention .  The patient's history has been reviewed, patient examined, no change in status, stable for surgery.  I have reviewed the patient's chart and labs.  Questions were answered to the patient's satisfaction.    Cath Lab Visit (complete for each Cath Lab visit)  Clinical Evaluation Leading to the Procedure:   ACS: Yes.   (unstable angina)  Non-ACS:    Anginal Classification: CCS III (progressing over last last 2 weeks; now present with light activity)  Anti-ischemic medical therapy: No Therapy  Non-Invasive Test Results: High-risk stress test findings: cardiac mortality >3%/year  Prior CABG: No previous CABG  [name]

## 2016-08-25 ENCOUNTER — Encounter (HOSPITAL_COMMUNITY): Payer: Self-pay | Admitting: Internal Medicine

## 2016-09-09 ENCOUNTER — Ambulatory Visit: Payer: PRIVATE HEALTH INSURANCE | Admitting: Cardiovascular Disease

## 2016-10-01 ENCOUNTER — Ambulatory Visit (INDEPENDENT_AMBULATORY_CARE_PROVIDER_SITE_OTHER): Payer: Commercial Managed Care - PPO | Admitting: Cardiovascular Disease

## 2016-10-01 ENCOUNTER — Encounter: Payer: Self-pay | Admitting: Cardiovascular Disease

## 2016-10-01 VITALS — BP 120/88 | HR 88 | Ht 70.0 in | Wt 264.0 lb

## 2016-10-01 DIAGNOSIS — I25118 Atherosclerotic heart disease of native coronary artery with other forms of angina pectoris: Secondary | ICD-10-CM

## 2016-10-01 DIAGNOSIS — I209 Angina pectoris, unspecified: Secondary | ICD-10-CM | POA: Diagnosis not present

## 2016-10-01 DIAGNOSIS — I10 Essential (primary) hypertension: Secondary | ICD-10-CM | POA: Diagnosis not present

## 2016-10-01 NOTE — Patient Instructions (Signed)

## 2016-10-01 NOTE — Progress Notes (Signed)
SUBJECTIVE: The patient presents for follow-up after undergoing coronary angiography on 08/22/16. This demonstrated a 90% stenosis involving a dominant but small septal branch. There was otherwise minimal, nonobstructive coronary artery disease involving the distal LAD. There was upper normal left ventricular filling pressure and hyperdynamic left ventricular systolic function. Dr. Okey Dupre initiated Imdur 30 mg daily. He said he would be very hesitant to consider PCI to the septal branch unless the patient had refractory pain despite exhaustion of all medical treatment options.  He currently denies exertional chest pain. He tried Imdur for 2 days but developed severe headaches and stopped it.  He has been taking benazepril 20 mg daily. He was prescribed Crestor 20 mg daily by his PCP but he has not taken it.  He has significantly altered his diet and no longer eats that much pork. He used to have it every morning. He now eats oatmeal, Cheerios, and fruit for breakfast.  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   Review of Systems: As per "subjective", otherwise negative.  No Known Allergies  Current Outpatient Prescriptions  Medication Sig Dispense Refill  . aspirin EC 81 MG tablet Take 81 mg by mouth every morning.    . Aspirin-Salicylamide-Caffeine (BC HEADACHE POWDER PO) Take by mouth.    . benazepril (LOTENSIN) 40 MG tablet Take 20 mg by mouth daily.     . diphenhydrAMINE (BENADRYL) 50 MG capsule Take 50 mg by mouth at bedtime as needed.    . fluticasone (FLONASE) 50 MCG/ACT nasal spray Place 1 spray into both nostrils daily as needed for allergies or rhinitis.    . naproxen sodium (ANAPROX) 220 MG tablet Take 110 mg by mouth every morning.     . nitroGLYCERIN (NITROSTAT) 0.4 MG SL tablet Place 0.4 mg under the tongue every 5 (five) minutes as needed for chest pain.     No current facility-administered medications for this visit.     Past Medical History:    Diagnosis Date  . Arthritis 05-09-11   osteoarthritis-all joints  . Depression 05-09-11   past hx. depression, none recent  . Dysrhythmia 05-09-11   epidoses of bradycardia, stress and other test negative-Lopressor d/c.  [name] GERD (gastroesophageal reflux disease) 05-09-11   tx. Pantoprazole  . Hypertension   . Hypertrophic synovitis    L KNEE  . Seasonal allergies 05-09-11   seasonal allergies with sinus issues    Past Surgical History:  Procedure Laterality Date  . CARPAL TUNNEL RELEASE  05-09-11   Bilaterally '02  . HEMORRHOID SURGERY  05-09-11   '96  . KNEE ARTHROSCOPY     RT KNEE  . KNEE ARTHROSCOPY Left 02/01/2014   Procedure: LEFT KNEE ARTHROSCOPY WITH SYNOVECTOMY ;  Surgeon: Loanne Drilling, MD;  Location: [facility];  Service: Orthopedics;  Laterality: Left;  . LEFT HEART CATH AND CORONARY ANGIOGRAPHY N/A 08/22/2016   Procedure: Left Heart Cath and Coronary Angiography;  Surgeon: Yvonne Kendall, MD;  Location: [facility] INVASIVE CV LAB;  Service: Cardiovascular;  Laterality: N/A;  . NASAL SINUS SURGERY  05-09-11   2'12 [place]  . SPHINCTEROTOMY  05-09-11   '97  . TOTAL KNEE ARTHROPLASTY  05/12/2011   Procedure: TOTAL KNEE ARTHROPLASTY;  Surgeon: Loanne Drilling;  Location: [facility];  Service: Orthopedics;  Laterality: Left;  [name] VASECTOMY  05-09-11   '07    Social History   Social History  . Marital status: Married    Spouse name:  N/A  . Number of children: N/A  . Years of education: N/A   Occupational History  . Not on file.   Social History Main Topics  . Smoking status: Former Smoker    Quit date: 06/08/1992  . Smokeless tobacco: Never Used  . Alcohol use No  . Drug use: No  . Sexual activity: Yes   Other Topics Concern  . Not on file   Social History Narrative  . No narrative on file     Vitals:   10/01/16 1458  BP: 120/88  Pulse: 88  SpO2: 95%  Weight: 264 lb (119.7 kg)  Height: 5\' 10"  (1.778 m)    Wt Readings from Last 3 Encounters:  10/01/16  264 lb (119.7 kg)  08/22/16 266 lb (120.7 kg)  08/21/16 266 lb (120.7 kg)     PHYSICAL EXAM General: NAD HEENT: Normal. Neck: No JVD, no thyromegaly. Lungs: Clear to auscultation bilaterally with normal respiratory effort. CV: Nondisplaced PMI.  Regular rate and rhythm, normal S1/S2, no S3/S4, no murmur. No pretibial or periankle edema.  Abdomen: Soft, nontender, obese.  Neurologic: Alert and oriented.  Psych: Normal affect. Skin: Normal. Musculoskeletal: No gross deformities.    ECG: Most recent ECG reviewed.   Labs: Lab Results  Component Value Date/Time   K 4.0 08/22/2016 11:49 AM   BUN 10 08/22/2016 11:49 AM   CREATININE 0.81 08/22/2016 11:49 AM   ALT 104 (H) 05/09/2011 04:00 PM   HGB 16.6 08/22/2016 11:49 AM     Lipids: No results found for: LDLCALC, LDLDIRECT, CHOL, TRIG, HDL     ASSESSMENT AND PLAN: 1. CAD with angina: Continue aspirin. He did not tolerate Imdur. He denies exertional chest pain. I talked to him about the risks and benefits of statin therapy. He will consider it.  2. Malignant hypertension: Controlled. No changes to therapy. Continue benazepril 20 mg daily.    Disposition: Follow up 1 year.   Prentice DockerSuresh Moniqua Engebretsen, M.D., F.A.C.C.

## 2017-05-13 DIAGNOSIS — M25562 Pain in left knee: Secondary | ICD-10-CM | POA: Insufficient documentation

## 2017-05-13 DIAGNOSIS — Z96652 Presence of left artificial knee joint: Secondary | ICD-10-CM | POA: Insufficient documentation

## 2017-05-13 DIAGNOSIS — G8929 Other chronic pain: Secondary | ICD-10-CM | POA: Insufficient documentation

## 2019-01-05 ENCOUNTER — Emergency Department (HOSPITAL_COMMUNITY)
Admission: EM | Admit: 2019-01-05 | Discharge: 2019-01-06 | Disposition: A | Discharge status: Home / Self Care | Payer: 59 | Attending: Emergency Medicine | Admitting: Emergency Medicine

## 2019-01-05 ENCOUNTER — Encounter (HOSPITAL_COMMUNITY): Payer: Self-pay | Admitting: Emergency Medicine

## 2019-01-05 ENCOUNTER — Other Ambulatory Visit: Payer: Self-pay

## 2019-01-05 DIAGNOSIS — Z791 Long term (current) use of non-steroidal anti-inflammatories (NSAID): Secondary | ICD-10-CM | POA: Diagnosis not present

## 2019-01-05 DIAGNOSIS — Z87891 Personal history of nicotine dependence: Secondary | ICD-10-CM | POA: Diagnosis not present

## 2019-01-05 DIAGNOSIS — Z7982 Long term (current) use of aspirin: Secondary | ICD-10-CM | POA: Diagnosis not present

## 2019-01-05 DIAGNOSIS — I10 Essential (primary) hypertension: Secondary | ICD-10-CM | POA: Diagnosis not present

## 2019-01-05 DIAGNOSIS — Z79899 Other long term (current) drug therapy: Secondary | ICD-10-CM | POA: Insufficient documentation

## 2019-01-05 DIAGNOSIS — R739 Hyperglycemia, unspecified: Secondary | ICD-10-CM | POA: Insufficient documentation

## 2019-01-05 LAB — CBG MONITORING, ED: Glucose-Capillary: 263 mg/dL — ABNORMAL HIGH (ref 70–99)

## 2019-01-05 NOTE — ED Triage Notes (Addendum)
Patient states his CBG was 475 at home. Denies diabetes. Complaining of generalized weakness and polyuria x 1 month.

## 2019-01-06 DIAGNOSIS — Z79899 Other long term (current) drug therapy: Secondary | ICD-10-CM | POA: Diagnosis not present

## 2019-01-06 DIAGNOSIS — Z7982 Long term (current) use of aspirin: Secondary | ICD-10-CM | POA: Diagnosis not present

## 2019-01-06 DIAGNOSIS — Z791 Long term (current) use of non-steroidal anti-inflammatories (NSAID): Secondary | ICD-10-CM | POA: Diagnosis not present

## 2019-01-06 DIAGNOSIS — R739 Hyperglycemia, unspecified: Secondary | ICD-10-CM | POA: Diagnosis not present

## 2019-01-06 DIAGNOSIS — Z87891 Personal history of nicotine dependence: Secondary | ICD-10-CM | POA: Diagnosis not present

## 2019-01-06 DIAGNOSIS — I10 Essential (primary) hypertension: Secondary | ICD-10-CM | POA: Diagnosis not present

## 2019-01-06 LAB — BASIC METABOLIC PANEL
Anion gap: 8 (ref 5–15)
BUN: 22 mg/dL — ABNORMAL HIGH (ref 6–20)
CO2: 23 mmol/L (ref 22–32)
Calcium: 8.7 mg/dL — ABNORMAL LOW (ref 8.9–10.3)
Chloride: 99 mmol/L (ref 98–111)
Creatinine, Ser: 0.92 mg/dL (ref 0.61–1.24)
GFR calc Af Amer: >60 mL/min (ref >60)
GFR calc non Af Amer: >60 mL/min (ref >60)
Glucose, Bld: 290 mg/dL — ABNORMAL HIGH (ref 70–99)
Potassium: 3.6 mmol/L (ref 3.5–5.1)
Sodium: 130 mmol/L — ABNORMAL LOW (ref 135–145)

## 2019-01-06 LAB — CBC
HCT: 46.1 % (ref 39.0–52.0)
Hemoglobin: 16 g/dL (ref 13.0–17.0)
MCH: 30.4 pg (ref 26.0–34.0)
MCHC: 34.7 g/dL (ref 30.0–36.0)
MCV: 87.6 fL (ref 80.0–100.0)
Platelets: 248 10*3/uL (ref 150–400)
RBC: 5.26 MIL/uL (ref 4.22–5.81)
RDW: 12.3 % (ref 11.5–15.5)
WBC: 11.3 10*3/uL — ABNORMAL HIGH (ref 4.0–10.5)
nRBC: 0 % (ref 0.0–0.2)

## 2019-01-06 LAB — CBG MONITORING, ED: Glucose-Capillary: 269 mg/dL — ABNORMAL HIGH (ref 70–99)

## 2019-01-06 MED ORDER — INSULIN ASPART 100 UNIT/ML ~~LOC~~ SOLN
5.0000 [IU] | Freq: Once | SUBCUTANEOUS | Status: AC
  Administered 2019-01-06: 02:00:00 5 [IU] via SUBCUTANEOUS
  Filled 2019-01-06: qty 1

## 2019-01-06 MED ORDER — METFORMIN HCL 500 MG PO TABS: 500.0000 mg | ORAL_TABLET | Freq: Two times a day (BID) | ORAL | 5 refills | Status: DC

## 2019-01-06 MED ORDER — SODIUM CHLORIDE 0.9 % IV BOLUS
1000.0000 mL | Freq: Once | INTRAVENOUS | Status: AC
  Administered 2019-01-06: 1000 mL via INTRAVENOUS

## 2019-01-06 NOTE — ED Notes (Signed)
Patient given water to drink.  

## 2019-01-06 NOTE — ED Provider Notes (Signed)
[facility] EMERGENCY DEPARTMENT Provider Note   CSN: [id number] Arrival date & time: 01/05/19  2242    History   Chief Complaint Chief Complaint  Patient presents with  . Hyperglycemia    HPI Blake Mcmillan is a 58 y.o. male.     Patient presents to the emergency department for several weeks of generalized malaise with polyuria and polydipsia.  Checked his blood sugar at home tonight and it was 475.  He has no history of diabetes.  No recent illness.  No chest pain, shortness of breath.  No COVID-19 symptoms.     Past Medical History:  Diagnosis Date  . Arthritis 05-09-11   osteoarthritis-all joints  . Depression 05-09-11   past hx. depression, none recent  . Dysrhythmia 05-09-11   epidoses of bradycardia, stress and other test negative-Lopressor d/c.  [name]. GERD (gastroesophageal reflux disease) 05-09-11   tx. Pantoprazole  . Hypertension   . Hypertrophic synovitis    L KNEE  . Seasonal allergies 05-09-11   seasonal allergies with sinus issues    Patient Active Problem List   Diagnosis Date Noted  . Unstable angina (HCC) 08/22/2016  . Abnormal stress test 08/22/2016  . 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 Postop Hyponatremia 05/15/2011    Past Surgical History:  Procedure Laterality Date  . CARPAL TUNNEL RELEASE  05-09-11   Bilaterally '02  . HEMORRHOID SURGERY  05-09-11   '96  . KNEE ARTHROSCOPY     RT KNEE  . KNEE ARTHROSCOPY Left 02/01/2014   Procedure: LEFT KNEE ARTHROSCOPY WITH SYNOVECTOMY ;  Surgeon: Loanne DrillingFrank [name] V, MD;  Location: [facility];  Service: Orthopedics;  Laterality: Left;  . LEFT HEART CATH AND CORONARY ANGIOGRAPHY N/A 08/22/2016   Procedure: Left Heart Cath and Coronary Angiography;  Surgeon: Yvonne Kendallhristopher End, MD;  [facility];  Service: Cardiovascular;  Laterality: N/A;  . NASAL SINUS SURGERY  05-09-11   2'12 [place]  . SPHINCTEROTOMY  05-09-11   '97  . TOTAL KNEE ARTHROPLASTY  05/12/2011   Procedure:  TOTAL KNEE ARTHROPLASTY;  Surgeon: Loanne DrillingFrank V [name];  Location: [facility];  Service: Orthopedics;  Laterality: Left;  [name]. VASECTOMY  05-09-11   '07        Home Medications    Prior to Admission medications   Medication Sig Start Date End Date Taking? Authorizing Provider  aspirin EC 81 MG tablet Take 81 mg by mouth every morning.    [provider]  Aspirin-Salicylamide-Caffeine (BC HEADACHE POWDER PO) Take by mouth.    [provider]  benazepril (LOTENSIN) 40 MG tablet Take 20 mg by mouth daily.     [provider]  diphenhydrAMINE (BENADRYL) 50 MG capsule Take 50 mg by mouth at bedtime as needed.    [provider]  fluticasone (FLONASE) 50 MCG/ACT nasal spray Place 1 spray into both nostrils daily as needed for allergies or rhinitis.    [provider]  metFORMIN (GLUCOPHAGE) 500 MG tablet Take 1 tablet (500 mg total) by mouth 2 (two) times daily with a meal. 01/06/19   Pollina, Canary Brimhristopher J, MD  naproxen sodium (ANAPROX) 220 MG tablet Take 110 mg by mouth every morning.     [provider]  nitroGLYCERIN (NITROSTAT) 0.4 MG SL tablet Place 0.4 mg under the tongue every 5 (five) minutes as needed for chest pain.    [provider]    Family History Family History  Problem Relation Age of Onset  .  Hypertension Mother   . Heart disease Father   . Heart attack Father   . Heart disease Brother   . Heart attack Brother   . Heart attack Maternal Grandfather     Social History Social History   Tobacco Use  . Smoking status: Former Smoker    Quit date: 06/08/1992    Years since quitting: 26.5  . Smokeless tobacco: Never Used  Substance Use Topics  . Alcohol use: No  . Drug use: No     Allergies   Patient has no known allergies.   Review of Systems Review of Systems  Endocrine: Positive for polydipsia and polyuria.  All other systems reviewed and are negative.    Physical Exam Updated Vital Signs BP (!)  146/85 (BP Location: Right Arm)   Pulse 65   Temp 98.2 F (36.8 C) (Oral)   Resp 18   Ht 5\' 10"  (1.778 m)   Wt 117.9 kg   SpO2 98%   BMI 37.31 kg/m   Physical Exam Vitals signs and nursing note reviewed.  Constitutional:      General: He is not in acute distress.    Appearance: Normal appearance. He is well-developed.  HENT:     Head: Normocephalic and atraumatic.     Right Ear: Hearing normal.     Left Ear: Hearing normal.     Nose: Nose normal.  Eyes:     Conjunctiva/sclera: Conjunctivae normal.     Pupils: Pupils are equal, round, and reactive to light.  Neck:     Musculoskeletal: Normal range of motion and neck supple.  Cardiovascular:     Rate and Rhythm: Regular rhythm.     Heart sounds: S1 normal and S2 normal. No murmur. No friction rub. No gallop.   Pulmonary:     Effort: Pulmonary effort is normal. No respiratory distress.     Breath sounds: Normal breath sounds.  Chest:     Chest wall: No tenderness.  Abdominal:     General: Bowel sounds are normal.     Palpations: Abdomen is soft.     Tenderness: There is no abdominal tenderness. There is no guarding or rebound. Negative signs include Murphy's sign and McBurney's sign.     Hernia: No hernia is present.  Musculoskeletal: Normal range of motion.  Skin:    General: Skin is warm and dry.     Findings: No rash.  Neurological:     Mental Status: He is alert and oriented to person, place, and time.     GCS: GCS eye subscore is 4. GCS verbal subscore is 5. GCS motor subscore is 6.     Cranial Nerves: No cranial nerve deficit.     Sensory: No sensory deficit.     Coordination: Coordination normal.  Psychiatric:        Speech: Speech normal.        Behavior: Behavior normal.        Thought Content: Thought content normal.      ED Treatments / Results  Labs (all labs ordered are listed, but only abnormal results are displayed) Labs Reviewed  BASIC METABOLIC PANEL - Abnormal; Notable for the following  components:      Result Value   Sodium 130 (*)    Glucose, Bld 290 (*)    BUN 22 (*)    Calcium 8.7 (*)    All other components within normal limits  CBC - Abnormal; Notable for the following components:   WBC 11.3 (*)  All other components within normal limits  CBG MONITORING, ED - Abnormal; Notable for the following components:   Glucose-Capillary 263 (*)    All other components within normal limits  URINALYSIS, ROUTINE W REFLEX MICROSCOPIC  CBG MONITORING, ED    EKG None  Radiology No results found.  Procedures Procedures (including critical care time)  Medications Ordered in ED Medications  insulin aspart (novoLOG) injection 5 Units (has no administration in time range)  sodium chloride 0.9 % bolus 1,000 mL (0 mLs Intravenous Stopped 01/06/19 0131)     Initial Impression / Assessment and Plan / ED Course  I have reviewed the triage vital signs and the nursing notes.  Pertinent labs & imaging results that were available during my care of the patient were reviewed by me and considered in my medical decision making (see chart for details).        Patient presents with polyuria and polydipsia, elevated blood sugar consistent with new onset diabetes.  Patient hydrated and given small dose of insulin with correction of his insulin.  No evidence of DKA.  Will start metformin, follow-up with PCP.  Final Clinical Impressions(s) / ED Diagnoses   Final diagnoses:  Hyperglycemia    ED Discharge Orders         Ordered    metFORMIN (GLUCOPHAGE) 500 MG tablet  2 times daily with meals     01/06/19 0145           Orpah Greek, MD 01/06/19 0145

## 2019-01-10 DIAGNOSIS — E7849 Other hyperlipidemia: Secondary | ICD-10-CM | POA: Diagnosis not present

## 2019-01-10 DIAGNOSIS — E119 Type 2 diabetes mellitus without complications: Secondary | ICD-10-CM | POA: Diagnosis not present

## 2019-01-10 DIAGNOSIS — I10 Essential (primary) hypertension: Secondary | ICD-10-CM | POA: Diagnosis not present

## 2019-01-10 DIAGNOSIS — Z6836 Body mass index (BMI) 36.0-36.9, adult: Secondary | ICD-10-CM | POA: Diagnosis not present

## 2019-01-10 DIAGNOSIS — Z7689 Persons encountering health services in other specified circumstances: Secondary | ICD-10-CM | POA: Diagnosis not present

## 2019-01-10 MED FILL — EPINEPHRINE 0.3 MG AUTO-INJ: 0.3 | 30 days supply | Qty: 2 | Fill #0

## 2019-01-10 MED FILL — PRAVASTATIN SODIUM 20 MG TA: 20 | 90 days supply | Qty: 90 | Fill #0

## 2019-01-10 MED FILL — BENAZEPRIL HCL 40 MG TABLET: 40 | 90 days supply | Qty: 90 | Fill #0

## 2019-01-10 MED FILL — metFORMIN HCL 1000 MG TABS: 1000 | 90 days supply | Qty: 180 | Fill #0

## 2019-01-17 MED FILL — metFORMIN HCL 1000 MG TABS: 1000 | 90 days supply | Qty: 180 | Fill #0

## 2019-01-17 MED FILL — BENAZEPRIL HCL 40 MG TABLET: 40 | 90 days supply | Qty: 90 | Fill #0

## 2019-01-17 MED FILL — EPINEPHRINE 0.3 MG AUTO-INJ: 0.3 | 30 days supply | Qty: 2 | Fill #0

## 2019-04-05 MED FILL — metFORMIN HCL 1000 MG TABS: 1000 | 90 days supply | Qty: 180 | Fill #0

## 2019-04-13 DIAGNOSIS — E1165 Type 2 diabetes mellitus with hyperglycemia: Secondary | ICD-10-CM | POA: Diagnosis not present

## 2019-04-13 DIAGNOSIS — Z6835 Body mass index (BMI) 35.0-35.9, adult: Secondary | ICD-10-CM | POA: Diagnosis not present

## 2019-04-13 DIAGNOSIS — E7849 Other hyperlipidemia: Secondary | ICD-10-CM | POA: Diagnosis not present

## 2019-04-13 DIAGNOSIS — I10 Essential (primary) hypertension: Secondary | ICD-10-CM | POA: Diagnosis not present

## 2019-04-13 MED FILL — TADALAFIL 5 MG TABS: 5 | 90 days supply | Qty: 90 | Fill #0 | Status: TO

## 2019-04-13 MED FILL — BENAZEPRIL HCL 40 MG TABLET: 40 | 90 days supply | Qty: 90 | Fill #0

## 2019-04-13 MED FILL — PRAVASTATIN SODIUM 20 MG TA: 20 | 90 days supply | Qty: 90 | Fill #0 | Status: TO

## 2019-04-13 MED FILL — PRAVASTATIN SODIUM 20 MG TA: 20 | 90 days supply | Qty: 90 | Fill #0

## 2019-04-13 MED FILL — BENAZEPRIL HCL 40 MG TABLET: 40 | 90 days supply | Qty: 90 | Fill #0 | Status: TO

## 2019-04-28 MED FILL — TADALAFIL 5 MG TABS: 5 | 90 days supply | Qty: 90 | Fill #0

## 2019-07-07 ENCOUNTER — Ambulatory Visit
Admission: EM | Admit: 2019-07-07 | Discharge: 2019-07-07 | Disposition: A | Discharge status: Home / Self Care | Payer: 59 | Attending: Emergency Medicine | Admitting: Emergency Medicine

## 2019-07-07 ENCOUNTER — Other Ambulatory Visit: Payer: Self-pay

## 2019-07-07 DIAGNOSIS — J0140 Acute pansinusitis, unspecified: Secondary | ICD-10-CM

## 2019-07-07 DIAGNOSIS — B001 Herpesviral vesicular dermatitis: Secondary | ICD-10-CM

## 2019-07-07 DIAGNOSIS — Z760 Encounter for issue of repeat prescription: Secondary | ICD-10-CM

## 2019-07-07 DIAGNOSIS — Z20822 Contact with and (suspected) exposure to covid-19: Secondary | ICD-10-CM

## 2019-07-07 MED ORDER — VALACYCLOVIR HCL 1 G PO TABS: ORAL_TABLET | ORAL | 0 refills | Status: DC

## 2019-07-07 MED ORDER — AMOXICILLIN-POT CLAVULANATE 875-125 MG PO TABS: 1.0000 | ORAL_TABLET | Freq: Two times a day (BID) | ORAL | 0 refills | Status: AC

## 2019-07-07 MED ORDER — BENZONATATE 100 MG PO CAPS: 100.0000 mg | ORAL_CAPSULE | Freq: Three times a day (TID) | ORAL | 0 refills | Status: DC

## 2019-07-07 MED ORDER — CETIRIZINE HCL 10 MG PO TABS: 10.0000 mg | ORAL_TABLET | Freq: Every day | ORAL | 0 refills | Status: DC

## 2019-07-07 NOTE — ED Provider Notes (Signed)
[place]   [id number] 07/07/19 Arrival Time: 1217   CC: COVID symptoms  SUBJECTIVE: History from: patient.  Blake Mcmillan is a 59 y.o. male who presents with nasal congestion, sinus pain/ pressure, productive cough with clear sputum, and a few episodes of vertigo that began >1 week ago.  Denies sick exposure to COVID, flu or strep.  Denies recent travel.  Has tried OTC medication with relief of vertigo.  Symptoms are made worse with position changes.  Reports previous symptoms in the past with sinus infections.   Denies fever, chills, fatigue, rhinorrhea, sore throat, SOB, wheezing, chest pain, changes in bowel or bladder habits.    Also request prescription of valtrex for cold sore.    ROS: As per HPI.  All other pertinent ROS negative.     Past Medical History:  Diagnosis Date  . Arthritis 05-09-11   osteoarthritis-all joints  . Depression 05-09-11   past hx. depression, none recent  . Dysrhythmia 05-09-11   epidoses of bradycardia, stress and other test negative-Lopressor d/c.  [name] GERD (gastroesophageal reflux disease) 05-09-11   tx. Pantoprazole  . Hypertension   . Hypertrophic synovitis    L KNEE  . Seasonal allergies 05-09-11   seasonal allergies with sinus issues   Past Surgical History:  Procedure Laterality Date  . CARPAL TUNNEL RELEASE  05-09-11   Bilaterally '02  . HEMORRHOID SURGERY  05-09-11   '96  . KNEE ARTHROSCOPY     RT KNEE  . KNEE ARTHROSCOPY Left 02/01/2014   Procedure: LEFT KNEE ARTHROSCOPY WITH SYNOVECTOMY ;  Surgeon: Gearlean Alf, MD;  Location: [facility];  Service: Orthopedics;  Laterality: Left;  . LEFT HEART CATH AND CORONARY ANGIOGRAPHY N/A 08/22/2016   Procedure: Left Heart Cath and Coronary Angiography;  Surgeon: Nelva Bush, MD;  Location: [place] CV [facility];  Service: Cardiovascular;  Laterality: N/A;  . NASAL SINUS SURGERY  05-09-11   2'12 [place]  . SPHINCTEROTOMY  05-09-11   '97  . TOTAL KNEE ARTHROPLASTY   05/12/2011   Procedure: TOTAL KNEE ARTHROPLASTY;  Surgeon: Gearlean Alf;  Location: [facility];  Service: Orthopedics;  Laterality: Left;  [name] VASECTOMY  05-09-11   '07   No Known Allergies No current facility-administered medications on file prior to encounter.   Current Outpatient Medications on File Prior to Encounter  Medication Sig Dispense Refill  . aspirin EC 81 MG tablet Take 81 mg by mouth every morning.    . Aspirin-Salicylamide-Caffeine (BC HEADACHE POWDER PO) Take by mouth.    . benazepril (LOTENSIN) 40 MG tablet Take 20 mg by mouth daily.     . diphenhydrAMINE (BENADRYL) 50 MG capsule Take 50 mg by mouth at bedtime as needed.    . fluticasone (FLONASE) 50 MCG/ACT nasal spray Place 1 spray into both nostrils daily as needed for allergies or rhinitis.    . naproxen sodium (ANAPROX) 220 MG tablet Take 110 mg by mouth every morning.     . nitroGLYCERIN (NITROSTAT) 0.4 MG SL tablet Place 0.4 mg under the tongue every 5 (five) minutes as needed for chest pain.    . [DISCONTINUED] metFORMIN (GLUCOPHAGE) 500 MG tablet Take 1 tablet (500 mg total) by mouth 2 (two) times daily with a meal. 60 tablet 5   Social History   Socioeconomic History  . Marital status: Married    Spouse name: Not on file  . Number of children: Not on file  . Years of education: Not on file  .  Highest education level: Not on file  Occupational History  . Not on file  Tobacco Use  . Smoking status: Former Smoker    Quit date: 06/08/1992    Years since quitting: 27.0  . Smokeless tobacco: Never Used  Substance and Sexual Activity  . Alcohol use: No  . Drug use: No  . Sexual activity: Yes  Other Topics Concern  . Not on file  Social History Narrative  . Not on file   Social Determinants of Health   Financial Resource Strain:   . Difficulty of Paying Living Expenses: Not on file  Food Insecurity:   . Worried About Programme researcher, broadcasting/film/video in the Last Year: Not on file  . Ran Out of Food in the Last Year:  Not on file  Transportation Needs:   . Lack of Transportation (Medical): Not on file  . Lack of Transportation (Non-Medical): Not on file  Physical Activity:   . Days of Exercise per Week: Not on file  . Minutes of Exercise per Session: Not on file  Stress:   . Feeling of Stress : Not on file  Social Connections:   . Frequency of Communication with Friends and Family: Not on file  . Frequency of Social Gatherings with Friends and Family: Not on file  . Attends Religious Services: Not on file  . Active Member of Clubs or Organizations: Not on file  . Attends Banker Meetings: Not on file  . Marital Status: Not on file  Intimate Partner Violence:   . Fear of Current or Ex-Partner: Not on file  . Emotionally Abused: Not on file  . Physically Abused: Not on file  . Sexually Abused: Not on file   Family History  Problem Relation Age of Onset  . Hypertension Mother   . Heart disease Father   . Heart attack Father   . Heart disease Brother   . Heart attack Brother   . Heart attack Maternal Grandfather     OBJECTIVE:  Vitals:   07/07/19 1233  BP: 133/80  Pulse: 65  Resp: 20  Temp: 97.6 F (36.4 C)  SpO2: 96%    General appearance: alert; appears mildly fatigued, but nontoxic; speaking in full sentences and tolerating own secretions HEENT: NCAT; Ears: EACs clear, TMs pearly gray; Eyes: PERRL.  EOM grossly intact. Sinuses: TTP over maxillary sinuses; Nose: nares patent without rhinorrhea, Throat: oropharynx clear, tonsils non erythematous or enlarged, uvula midline; erythematous vesicle localized to inside of LT upper lip Neck: supple without LAD Lungs: unlabored respirations, symmetrical air entry; cough: mild; no respiratory distress; CTAB Heart: regular rate and rhythm.   Skin: warm and dry Psychological: alert and cooperative; normal mood and affect  ASSESSMENT & PLAN:  1. Suspected COVID-19 virus infection   2. Acute non-recurrent pansinusitis   3. Cold  sore   4. Medication refill     Meds ordered this encounter  Medications  . valACYclovir (VALTREX) 1000 MG tablet    Sig: 2 g ORALLY twice daily for 1 day; separate doses by 12 hours; start at the earliest onset of symptoms    Dispense:  4 tablet    Refill:  0    Order Specific Question:   Supervising Provider    Answer:   Eustace Moore [[id number]]  . amoxicillin-clavulanate (AUGMENTIN) 875-125 MG tablet    Sig: Take 1 tablet by mouth every 12 (twelve) hours for 10 days.    Dispense:  20 tablet    Refill:  0    Order Specific Question:   Supervising Provider    Answer:   Eustace Moore [[id number]]  . cetirizine (ZYRTEC) 10 MG tablet    Sig: Take 1 tablet (10 mg total) by mouth daily.    Dispense:  30 tablet    Refill:  0    Order Specific Question:   Supervising Provider    Answer:   Eustace Moore [[id number]]  . benzonatate (TESSALON) 100 MG capsule    Sig: Take 1 capsule (100 mg total) by mouth every 8 (eight) hours.    Dispense:  21 capsule    Refill:  0    Order Specific Question:   Supervising Provider    Answer:   Eustace Moore [[id number]]    COVID testing ordered.  It will take between 2-5 days for test results.  Someone will contact you regarding abnormal results.    In the meantime: You should remain isolated in your home for 10 days from symptom onset AND greater than 72 hours after symptoms resolution (absence of fever without the use of fever-reducing medication and improvement in respiratory symptoms), whichever is longer Get plenty of rest and push fluids We will treat for possible sinus infection.  Augmentin prescribed.  Take as directed and to completion Tessalon Perles prescribed for cough Use OTC zyrtec for nasal congestion, runny nose, and/or sore throat Use OTC flonase for nasal congestion and runny nose Use medications daily for symptom relief Use OTC medications like ibuprofen or tylenol as needed fever or pain Call or go to the ED if you  have any new or worsening symptoms such as fever, worsening cough, shortness of breath, chest tightness, chest pain, turning blue, changes in mental status, etc...   Valtrex refilled for cold sore.    Reviewed expectations re: course of current medical issues. Questions answered. Outlined signs and symptoms indicating need for more acute intervention. Patient verbalized understanding. After Visit Summary given.         Rennis Harding, PA-C 07/07/19 1258

## 2019-07-07 NOTE — ED Triage Notes (Signed)
Pt also has painful cold sore as well

## 2019-07-07 NOTE — ED Triage Notes (Signed)
Pt has c/o nasal congestion and pressure  For past week, pt also experiencing vertigo and cough

## 2019-07-07 NOTE — Discharge Instructions (Signed)
COVID testing ordered.  It will take between 2-5 days for test results.  Someone will contact you regarding abnormal results.    In the meantime: You should remain isolated in your home for 10 days from symptom onset AND greater than 72 hours after symptoms resolution (absence of fever without the use of fever-reducing medication and improvement in respiratory symptoms), whichever is longer Get plenty of rest and push fluids We will treat for possible sinus infection.  Augmentin prescribed.  Take as directed and to completion Tessalon Perles prescribed for cough Use OTC zyrtec for nasal congestion, runny nose, and/or sore throat Use OTC flonase for nasal congestion and runny nose Use medications daily for symptom relief Use OTC medications like ibuprofen or tylenol as needed fever or pain Call or go to the ED if you have any new or worsening symptoms such as fever, worsening cough, shortness of breath, chest tightness, chest pain, turning blue, changes in mental status, etc...   Valtrex refilled for cold sore.

## 2019-07-08 LAB — NOVEL CORONAVIRUS, NAA: SARS-CoV-2, NAA: NOT DETECTED

## 2019-08-11 DIAGNOSIS — Z6835 Body mass index (BMI) 35.0-35.9, adult: Secondary | ICD-10-CM | POA: Diagnosis not present

## 2019-08-11 DIAGNOSIS — E7849 Other hyperlipidemia: Secondary | ICD-10-CM | POA: Diagnosis not present

## 2019-08-11 DIAGNOSIS — I10 Essential (primary) hypertension: Secondary | ICD-10-CM | POA: Diagnosis not present

## 2019-08-11 DIAGNOSIS — E1165 Type 2 diabetes mellitus with hyperglycemia: Secondary | ICD-10-CM | POA: Diagnosis not present

## 2019-08-11 MED FILL — MONTELUKAST SOD 10 MG TAB: 10 | 90 days supply | Qty: 90 | Fill #0

## 2019-08-11 MED FILL — valACYclovir HCL 1 GM TABS: 1 | 2 days supply | Qty: 8 | Fill #0

## 2019-08-11 MED FILL — BENAZEPRIL HCL 40 MG TABLET: 40 | 90 days supply | Qty: 90 | Fill #0

## 2019-08-22 MED FILL — MONTELUKAST SOD 10 MG TAB: 10 | 90 days supply | Qty: 90 | Fill #0 | Status: TO

## 2019-08-22 MED FILL — BENAZEPRIL HCL 40 MG TABLET: 40 | 90 days supply | Qty: 90 | Fill #0 | Status: TO

## 2019-08-22 MED FILL — valACYclovir HCL 1 GM TABS: 1 | 2 days supply | Qty: 8 | Fill #0 | Status: TO

## 2019-09-01 MED FILL — MONTELUKAST SOD 10 MG TAB: 10 | 90 days supply | Qty: 90 | Fill #0

## 2019-09-01 MED FILL — BENAZEPRIL HCL 40 MG TABLET: 40 | 90 days supply | Qty: 90 | Fill #0

## 2019-09-01 MED FILL — valACYclovir HCL 1 GM TABS: 1 | 2 days supply | Qty: 8 | Fill #0

## 2019-09-28 ENCOUNTER — Other Ambulatory Visit (HOSPITAL_COMMUNITY)
Admission: RE | Admit: 2019-09-28 | Discharge: 2019-09-28 | Disposition: A | Discharge status: Home / Self Care | Payer: 59 | Source: Ambulatory Visit | Attending: Internal Medicine | Admitting: Internal Medicine

## 2019-09-28 ENCOUNTER — Other Ambulatory Visit: Payer: Self-pay

## 2019-09-28 DIAGNOSIS — E782 Mixed hyperlipidemia: Secondary | ICD-10-CM | POA: Diagnosis not present

## 2019-09-28 DIAGNOSIS — E119 Type 2 diabetes mellitus without complications: Secondary | ICD-10-CM | POA: Insufficient documentation

## 2019-09-28 DIAGNOSIS — I10 Essential (primary) hypertension: Secondary | ICD-10-CM | POA: Insufficient documentation

## 2019-09-28 LAB — HEMOGLOBIN A1C
Hgb A1c MFr Bld: 5.7 % — ABNORMAL HIGH (ref 4.8–5.6)
Mean Plasma Glucose: 116.89 mg/dL

## 2019-09-28 LAB — LIPID PANEL
Cholesterol: 110 mg/dL (ref 0–200)
HDL: 39 mg/dL — ABNORMAL LOW (ref >40)
LDL Cholesterol: 63 mg/dL (ref 0–99)
Total CHOL/HDL Ratio: 2.8 RATIO
Triglycerides: 39 mg/dL (ref <150)
VLDL: 8 mg/dL (ref 0–40)

## 2019-09-28 LAB — BASIC METABOLIC PANEL
Anion gap: 5 (ref 5–15)
BUN: 13 mg/dL (ref 6–20)
CO2: 23 mmol/L (ref 22–32)
Calcium: 8.6 mg/dL — ABNORMAL LOW (ref 8.9–10.3)
Chloride: 104 mmol/L (ref 98–111)
Creatinine, Ser: 0.83 mg/dL (ref 0.61–1.24)
GFR calc Af Amer: >60 mL/min (ref >60)
GFR calc non Af Amer: >60 mL/min (ref >60)
Glucose, Bld: 114 mg/dL — ABNORMAL HIGH (ref 70–99)
Potassium: 4.2 mmol/L (ref 3.5–5.1)
Sodium: 132 mmol/L — ABNORMAL LOW (ref 135–145)

## 2019-12-05 ENCOUNTER — Other Ambulatory Visit (HOSPITAL_COMMUNITY): Payer: Self-pay | Admitting: Internal Medicine

## 2019-12-05 DIAGNOSIS — Z6836 Body mass index (BMI) 36.0-36.9, adult: Secondary | ICD-10-CM | POA: Diagnosis not present

## 2019-12-05 DIAGNOSIS — E1165 Type 2 diabetes mellitus with hyperglycemia: Secondary | ICD-10-CM | POA: Diagnosis not present

## 2019-12-05 DIAGNOSIS — I10 Essential (primary) hypertension: Secondary | ICD-10-CM | POA: Diagnosis not present

## 2019-12-05 DIAGNOSIS — E7849 Other hyperlipidemia: Secondary | ICD-10-CM | POA: Diagnosis not present

## 2019-12-05 MED FILL — valACYclovir HCL 1 GM TABS: 1 | 2 days supply | Qty: 8 | Fill #0

## 2019-12-25 DIAGNOSIS — E119 Type 2 diabetes mellitus without complications: Secondary | ICD-10-CM | POA: Insufficient documentation

## 2019-12-27 MED FILL — NITROGLYCERIN 0.4 MG TAB SL: 0.4 | 25 days supply | Qty: 25 | Fill #0

## 2019-12-27 MED FILL — valACYclovir HCL 1 GM TABS: 1 | 2 days supply | Qty: 8 | Fill #0

## 2020-02-24 DIAGNOSIS — H5203 Hypermetropia, bilateral: Secondary | ICD-10-CM | POA: Diagnosis not present

## 2020-02-24 DIAGNOSIS — H524 Presbyopia: Secondary | ICD-10-CM | POA: Diagnosis not present

## 2020-02-24 DIAGNOSIS — H52221 Regular astigmatism, right eye: Secondary | ICD-10-CM | POA: Diagnosis not present

## 2020-02-24 DIAGNOSIS — H40013 Open angle with borderline findings, low risk, bilateral: Secondary | ICD-10-CM | POA: Diagnosis not present

## 2020-02-24 DIAGNOSIS — H2513 Age-related nuclear cataract, bilateral: Secondary | ICD-10-CM | POA: Diagnosis not present

## 2020-04-02 ENCOUNTER — Telehealth: Payer: Self-pay | Admitting: Orthopedic Surgery

## 2020-04-02 NOTE — Telephone Encounter (Signed)
Patient left notes for Dr Romeo Apple to review.  He has done so and has declined on an appointment for this patient.  Dr Romeo Apple  did however recommend Dr. Turner Daniels at [facility].  I  have called and left Blake Mcmillan know of this.  He will come back to get his notes and CD

## 2020-06-13 MED FILL — valACYclovir HCL 1 GM TABS: 1 | 2 days supply | Qty: 8 | Fill #1

## 2020-07-02 ENCOUNTER — Other Ambulatory Visit (HOSPITAL_COMMUNITY): Payer: Self-pay | Admitting: Internal Medicine

## 2020-07-02 DIAGNOSIS — Z6837 Body mass index (BMI) 37.0-37.9, adult: Secondary | ICD-10-CM | POA: Diagnosis not present

## 2020-07-02 DIAGNOSIS — I10 Essential (primary) hypertension: Secondary | ICD-10-CM | POA: Diagnosis not present

## 2020-07-02 DIAGNOSIS — E7849 Other hyperlipidemia: Secondary | ICD-10-CM | POA: Diagnosis not present

## 2020-07-02 DIAGNOSIS — E1169 Type 2 diabetes mellitus with other specified complication: Secondary | ICD-10-CM | POA: Diagnosis not present

## 2020-07-02 MED FILL — BENAZEPRIL HCL 40 MG TABLET: 40 | 90 days supply | Qty: 45 | Fill #0

## 2020-07-02 MED FILL — GABAPENTIN 100 MG CAPSULE: 100 | 90 days supply | Qty: 270 | Fill #0

## 2020-07-02 MED FILL — valACYclovir HCL 1 GM TABS: 1 | 2 days supply | Qty: 8 | Fill #0

## 2020-07-11 ENCOUNTER — Other Ambulatory Visit (HOSPITAL_COMMUNITY): Payer: Self-pay | Admitting: Dentistry

## 2020-07-11 MED FILL — BENAZEPRIL HCL 40 MG TABLET: 40 | 90 days supply | Qty: 45 | Fill #0 | Status: TO

## 2020-07-11 MED FILL — valACYclovir HCL 1 GM TABS: 1 | 2 days supply | Qty: 8 | Fill #2

## 2020-07-11 MED FILL — BENAZEPRIL HCL 40 MG TABLET: 40 | 90 days supply | Qty: 45 | Fill #0

## 2020-08-15 ENCOUNTER — Encounter: Payer: Self-pay | Admitting: *Deleted

## 2020-08-15 NOTE — Progress Notes (Unsigned)
Cardiology Office Note  Date: 08/16/2020   ID: Blake Mcmillan, DOB 1960-08-09, MRN [id number]  PCP:  Toma Deiters, MD  Cardiologist:  Nona Dell, MD Electrophysiologist:  None   Chief Complaint  Patient presents with  . Cardiac follow-up  . Chest discomfort    History of Present Illness:  Blake Mcmillan is a 60 y.o. male referred for cardiology consultation by Dr. Olena Leatherwood to re-establish follow-up of CAD and with intermittent chest discomfort.  He was previously followed by Dr. Purvis Sheffield, last seen in 2018.  He is here today with his wife.  He tells me that over the last few months he has been experiencing an intermittent discomfort in his upper chest, sometimes in his arms.  He notices this in the evenings mainly after he has done fairly physical labor, not necessarily while he is doing the labor however.  He has not had to take any nitroglycerin.  I reviewed his medications which are outlined below.  Cardiac regimen includes aspirin, benazepril, and as needed nitroglycerin.  He has not been on statin therapy, last LDL was actually 63 however in May 2021. Also did not tolerate isosorbide previously due to severe headaches.  I reviewed his ECG today which shows sinus rhythm with Q-wave in lead III, not clearly diagnostic of infarct pattern however.  Cardiac history includes 90% septal branch that was managed medically in 2018, no major obstructive lesions within the larger epicardials.  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  Still active with farm work, raising callus.  Past Medical History:  Diagnosis Date  . CAD (coronary artery disease)    90% septal branch managed medically 2018  . Depression   . Essential hypertension   . GERD (gastroesophageal reflux disease)   . Hypertrophic synovitis    Left knee  . Osteoarthritis   . Seasonal allergies     Past Surgical History:  Procedure Laterality Date  . CARPAL TUNNEL RELEASE  05-09-11    Bilaterally '02  . HEMORRHOID SURGERY  05-09-11   '96  . KNEE ARTHROSCOPY     RT KNEE  . KNEE ARTHROSCOPY Left 02/01/2014   Procedure: LEFT KNEE ARTHROSCOPY WITH SYNOVECTOMY ;  Surgeon: Loanne Drilling, MD;  Location: [facility];  Service: Orthopedics;  Laterality: Left;  . LEFT HEART CATH AND CORONARY ANGIOGRAPHY N/A 08/22/2016   Procedure: Left Heart Cath and Coronary Angiography;  Surgeon: Yvonne Kendall, MD;  Location: [facility] INVASIVE CV LAB;  Service: Cardiovascular;  Laterality: N/A;  . NASAL SINUS SURGERY  05-09-11   2'12 [place]  . SPHINCTEROTOMY  05-09-11   '97  . TOTAL KNEE ARTHROPLASTY  05/12/2011   Procedure: TOTAL KNEE ARTHROPLASTY;  Surgeon: Loanne Drilling;  Location: [facility];  Service: Orthopedics;  Laterality: Left;  [name] VASECTOMY  05-09-11   '07    Current Outpatient Medications  Medication Sig Dispense Refill  . aspirin EC 81 MG tablet Take 81 mg by mouth every morning.    . benazepril (LOTENSIN) 40 MG tablet Take 20 mg by mouth daily.     . benzonatate (TESSALON) 100 MG capsule Take 1 capsule (100 mg total) by mouth every 8 (eight) hours. 21 capsule 0  . cetirizine (ZYRTEC) 10 MG tablet Take 1 tablet (10 mg total) by mouth daily. 30 tablet 0  . diphenhydrAMINE (BENADRYL) 50 MG capsule Take 50 mg by mouth at bedtime as needed.    . fluticasone (FLONASE) 50 MCG/ACT nasal spray Place 1  spray into both nostrils daily as needed for allergies or rhinitis.    . naproxen sodium (ANAPROX) 220 MG tablet Take 110 mg by mouth every morning.     . nitroGLYCERIN (NITROSTAT) 0.4 MG SL tablet Place 0.4 mg under the tongue every 5 (five) minutes as needed for chest pain.    . rosuvastatin (CRESTOR) 5 MG tablet Take 1 tablet (5 mg total) by mouth once a week. 12 tablet 1  . valACYclovir (VALTREX) 1000 MG tablet 2 g ORALLY twice daily for 1 day; separate doses by 12 hours; start at the earliest onset of symptoms 4 tablet 0   No current facility-administered medications for this visit.    Allergies:  Patient has no known allergies.   Social History: The patient  reports that he quit smoking about 28 years ago. His smoking use included cigarettes. He has never used smokeless tobacco. He reports that he does not drink alcohol and does not use drugs.   Family History: The patient's family history includes Heart attack in his brother, father, and maternal grandfather; Heart disease in his brother and father; Hypertension in his mother.   ROS: No palpitations or syncope.  Physical Exam: VS:  BP 118/72   Pulse 75   Ht 5\' 10"  (1.778 m)   Wt 265 lb (120.2 kg)   SpO2 98%   BMI 38.02 kg/m , BMI Body mass index is 38.02 kg/m.  Wt Readings from Last 3 Encounters:  08/16/20 265 lb (120.2 kg)  01/05/19 260 lb (117.9 kg)  10/01/16 264 lb (119.7 kg)    General: Patient appears comfortable at rest. HEENT: Conjunctiva and lids normal, wearing a mask. Neck: Supple, no elevated JVP or carotid bruits, no thyromegaly. Lungs: Clear to auscultation, nonlabored breathing at rest. Cardiac: Regular rate and rhythm, no S3 or significant systolic murmur, no pericardial rub. Abdomen: Soft, nontender, bowel sounds present. Extremities: No pitting edema, distal pulses 2+. Skin: Warm and dry. Musculoskeletal: No kyphosis. Neuropsychiatric: Alert and oriented x3, affect grossly appropriate.  ECG:  An ECG dated 08/22/2016 was personally reviewed today and demonstrated:  Sinus rhythm with incomplete right bundle branch block.  Recent Labwork: 09/28/2019: BUN 13; Creatinine, Ser 0.83; Potassium 4.2; Sodium 132     Component Value Date/Time   CHOL 110 09/28/2019 1058   TRIG 39 09/28/2019 1058   HDL 39 (L) 09/28/2019 1058   CHOLHDL 2.8 09/28/2019 1058   VLDL 8 09/28/2019 1058   LDLCALC 63 09/28/2019 1058    Other Studies Reviewed Today:  Cardiac catheterization 08/22/2016: Conclusions: 1. 90% stenosis involving dominant but small septal branch, which may account for ischemia noted on  recent stress test. 2. Minimal, non-obstructive coronary artery disease involving the distal LAD. 3. Upper normal left ventricular filling pressure. 4. Hyperdynamic left ventricular contraction (LVEF >65%).  Recommendations: 1. Medical therapy; will initiate isosorbide mononitrate 30 mg daily. I would be very hesitant to consider PCI to septal branch unless patient has refractory pain despite exhaustion of all medical treatment options. 2. Follow-up with Dr. 08/24/2016 to evaluate response to isosorbide mononitrate and discuss further medical therapy/risk factor modification.  Assessment and Plan:  1.  Atypical chest pain in the setting of known CAD with previous history of 90% septal branch that was managed medically in 2018, no obstructive disease in the major epicardials.  ECG shows no significant changes.  Current regimen includes aspirin, benazepril, and as needed nitroglycerin.  We will add Crestor 5 mg once weekly for now with plan to  recheck FLP and LFTs in 3 months.  He did not tolerate Imdur previously due to severe headaches.  Plan to obtain exercise Myoview to assess overall ischemic burden, test will be abnormal but question will be whether there is any significant change compared to previous Myoview in 2018.  2.  Essential hypertension, blood pressure has been well controlled on benazepril.   Medication Adjustments/Labs and Tests Ordered: Current medicines are reviewed at length with the patient today.  Concerns regarding medicines are outlined above.   Tests Ordered: Orders Placed This Encounter  Procedures  . NM Myocar Multi W/Spect W/Wall Motion / EF  . Hepatic function panel  . Lipid panel  . EKG 12-Lead    Medication Changes: Meds ordered this encounter  Medications  . rosuvastatin (CRESTOR) 5 MG tablet    Sig: Take 1 tablet (5 mg total) by mouth once a week.    Dispense:  12 tablet    Refill:  1    08/16/2020 NEW    Disposition:  Follow up test  results.  Signed, Jonelle Sidle, MD, [facility] 08/16/2020 9:41 AM    [facility] at [facility] [address] [place], [place], [place] [address] Phone: [phone]; Fax: [phone]

## 2020-08-16 ENCOUNTER — Encounter: Payer: Self-pay | Admitting: *Deleted

## 2020-08-16 ENCOUNTER — Other Ambulatory Visit (HOSPITAL_BASED_OUTPATIENT_CLINIC_OR_DEPARTMENT_OTHER): Payer: Self-pay

## 2020-08-16 ENCOUNTER — Encounter: Payer: Self-pay | Admitting: Cardiology

## 2020-08-16 ENCOUNTER — Ambulatory Visit: Payer: 59 | Admitting: Cardiology

## 2020-08-16 ENCOUNTER — Other Ambulatory Visit: Payer: Self-pay | Admitting: Cardiology

## 2020-08-16 VITALS — BP 118/72 | HR 75 | Ht 70.0 in | Wt 265.0 lb

## 2020-08-16 DIAGNOSIS — I25119 Atherosclerotic heart disease of native coronary artery with unspecified angina pectoris: Secondary | ICD-10-CM | POA: Diagnosis not present

## 2020-08-16 DIAGNOSIS — E7439 Other disorders of intestinal carbohydrate absorption: Secondary | ICD-10-CM

## 2020-08-16 DIAGNOSIS — R0789 Other chest pain: Secondary | ICD-10-CM | POA: Diagnosis not present

## 2020-08-16 DIAGNOSIS — Z79899 Other long term (current) drug therapy: Secondary | ICD-10-CM | POA: Diagnosis not present

## 2020-08-16 DIAGNOSIS — I10 Essential (primary) hypertension: Secondary | ICD-10-CM

## 2020-08-16 MED ORDER — ROSUVASTATIN CALCIUM 5 MG PO TABS
5.0000 mg | ORAL_TABLET | ORAL | 1 refills | Status: DC
  Filled 2020-08-16: qty 12, 84d supply, fill #0

## 2020-08-16 MED FILL — ROSUVASTATIN CALCIUM 5 MG T: 5 | 84 days supply | Qty: 12 | Fill #0

## 2020-08-16 NOTE — Patient Instructions (Addendum)
Medication Instructions:    Your physician has recommended you make the following change in your medication:   Start crestor 5 mg by mouth once per week  Continue other medications the same  Labwork: Your physician recommends that you return for a FASTING lipid/liver profile & HgA1C in 3 months. Please do not eat or drink for at least 8 hours when you have this done. You may take your medications that morning with a sip of water.  This may be done at [facility] from 8:00 am - 4:00 pm. No appointment is needed.  Covid test required for stress test 2-3 days prior. Please quarantine after covid test until stress test completed.  Testing/Procedures: Your physician has requested that you have en exercise stress myoview. For further information please visit [email or web address] Please follow instruction sheet, as given.  Follow-Up:  Your physician recommends that you schedule a follow-up appointment in: pending.  Any Other Special Instructions Will Be Listed Below (If Applicable).  If you need a refill on your cardiac medications before your next appointment, please call your pharmacy.

## 2020-08-16 NOTE — Addendum Note (Signed)
Addended by: Eustace Moore on: 08/16/2020 09:45 AM   Modules accepted: Orders

## 2020-08-25 ENCOUNTER — Other Ambulatory Visit (HOSPITAL_COMMUNITY): Payer: Self-pay

## 2020-08-31 ENCOUNTER — Other Ambulatory Visit (HOSPITAL_COMMUNITY)
Admission: RE | Admit: 2020-08-31 | Discharge: 2020-08-31 | Disposition: A | Discharge status: Home / Self Care | Payer: 59 | Source: Ambulatory Visit | Attending: Cardiology | Admitting: Cardiology

## 2020-08-31 ENCOUNTER — Other Ambulatory Visit: Payer: Self-pay

## 2020-08-31 DIAGNOSIS — Z20822 Contact with and (suspected) exposure to covid-19: Secondary | ICD-10-CM | POA: Insufficient documentation

## 2020-08-31 DIAGNOSIS — Z01812 Encounter for preprocedural laboratory examination: Secondary | ICD-10-CM | POA: Diagnosis not present

## 2020-08-31 LAB — SARS CORONAVIRUS 2 (TAT 6-24 HRS): SARS Coronavirus 2: NEGATIVE

## 2020-09-03 ENCOUNTER — Ambulatory Visit (HOSPITAL_COMMUNITY)
Admission: RE | Admit: 2020-09-03 | Discharge: 2020-09-03 | Disposition: A | Discharge status: Home / Self Care | Payer: 59 | Source: Ambulatory Visit | Attending: Cardiology | Admitting: Cardiology

## 2020-09-03 ENCOUNTER — Encounter (HOSPITAL_COMMUNITY)
Admission: RE | Admit: 2020-09-03 | Discharge: 2020-09-03 | Disposition: A | Discharge status: Home / Self Care | Payer: 59 | Source: Ambulatory Visit | Attending: Cardiology | Admitting: Cardiology

## 2020-09-03 DIAGNOSIS — I25119 Atherosclerotic heart disease of native coronary artery with unspecified angina pectoris: Secondary | ICD-10-CM | POA: Insufficient documentation

## 2020-09-03 LAB — NM MYOCAR MULTI W/SPECT W/WALL MOTION / EF
Estimated workload: 10.4 METS
Exercise duration (min): 9 min
Exercise duration (sec): 10 s
LV dias vol: 88 mL (ref 62–150)
LV sys vol: 40 mL
MPHR: 160 {beats}/min
Peak HR: 142 {beats}/min
Percent HR: 88 %
RATE: 0.39
Rest HR: 65 {beats}/min
SDS: 2
SRS: 0
SSS: 2
TID: 0.93

## 2020-09-03 MED ORDER — TECHNETIUM TC 99M TETROFOSMIN IV KIT
30.0000 | PACK | Freq: Once | INTRAVENOUS | Status: AC | PRN
  Administered 2020-09-03: 31.3 via INTRAVENOUS

## 2020-09-03 MED ORDER — REGADENOSON 0.4 MG/5ML IV SOLN
INTRAVENOUS | Status: AC
  Filled 2020-09-03: qty 5

## 2020-09-03 MED ORDER — SODIUM CHLORIDE FLUSH 0.9 % IV SOLN
INTRAVENOUS | Status: AC
  Administered 2020-09-03: 10 mL via INTRAVENOUS
  Filled 2020-09-03: qty 10

## 2020-09-03 MED ORDER — TECHNETIUM TC 99M TETROFOSMIN IV KIT
10.0000 | PACK | Freq: Once | INTRAVENOUS | Status: AC | PRN
  Administered 2020-09-03: 10.75 via INTRAVENOUS

## 2020-09-04 ENCOUNTER — Other Ambulatory Visit (HOSPITAL_COMMUNITY): Payer: Self-pay

## 2020-09-04 ENCOUNTER — Telehealth: Payer: Self-pay | Admitting: *Deleted

## 2020-09-04 NOTE — Telephone Encounter (Signed)
-----   Message from Jonelle Sidle, MD sent at 09/03/2020  5:43 PM EDT ----- Results reviewed. Please let him know the stress test looked good, no significant ischemia to suggest progressive CAD. Continue medical therapy and schedule follow-up in 6 months.

## 2020-09-04 NOTE — Telephone Encounter (Signed)
Patient informed. Copy sent to PCP °

## 2020-10-30 ENCOUNTER — Other Ambulatory Visit (HOSPITAL_COMMUNITY): Payer: Self-pay

## 2020-10-30 DIAGNOSIS — E7849 Other hyperlipidemia: Secondary | ICD-10-CM | POA: Diagnosis not present

## 2020-10-30 DIAGNOSIS — Z6836 Body mass index (BMI) 36.0-36.9, adult: Secondary | ICD-10-CM | POA: Diagnosis not present

## 2020-10-30 DIAGNOSIS — I10 Essential (primary) hypertension: Secondary | ICD-10-CM | POA: Diagnosis not present

## 2020-10-30 DIAGNOSIS — E1169 Type 2 diabetes mellitus with other specified complication: Secondary | ICD-10-CM | POA: Diagnosis not present

## 2020-10-30 MED ORDER — VALACYCLOVIR HCL 1 G PO TABS
ORAL_TABLET | ORAL | 3 refills | Status: DC
  Filled 2020-10-30 – 2020-12-24 (×2): qty 8, 2d supply, fill #0
  Filled 2021-03-12: qty 24, 6d supply, fill #1

## 2020-10-30 MED ORDER — BENAZEPRIL HCL 40 MG PO TABS
20.0000 mg | ORAL_TABLET | Freq: Every day | ORAL | 0 refills | Status: DC
  Filled 2020-10-30 – 2020-12-24 (×2): qty 45, 90d supply, fill #0

## 2020-12-24 ENCOUNTER — Other Ambulatory Visit (HOSPITAL_COMMUNITY): Payer: Self-pay

## 2021-01-02 ENCOUNTER — Other Ambulatory Visit (HOSPITAL_COMMUNITY): Payer: Self-pay

## 2021-01-02 MED ORDER — AZITHROMYCIN 250 MG PO TABS
ORAL_TABLET | ORAL | 0 refills | Status: DC
  Filled 2021-01-02: qty 6, 5d supply, fill #0

## 2021-02-11 ENCOUNTER — Other Ambulatory Visit (HOSPITAL_COMMUNITY)
Admission: RE | Admit: 2021-02-11 | Discharge: 2021-02-11 | Disposition: A | Discharge status: Home / Self Care | Payer: 59 | Source: Ambulatory Visit | Attending: Cardiology | Admitting: Cardiology

## 2021-02-11 ENCOUNTER — Other Ambulatory Visit: Payer: Self-pay

## 2021-02-11 ENCOUNTER — Other Ambulatory Visit (HOSPITAL_COMMUNITY)
Admission: RE | Admit: 2021-02-11 | Discharge: 2021-02-11 | Disposition: A | Discharge status: Home / Self Care | Payer: 59 | Source: Ambulatory Visit | Attending: Internal Medicine | Admitting: Internal Medicine

## 2021-02-11 DIAGNOSIS — I25119 Atherosclerotic heart disease of native coronary artery with unspecified angina pectoris: Secondary | ICD-10-CM | POA: Insufficient documentation

## 2021-02-11 DIAGNOSIS — E119 Type 2 diabetes mellitus without complications: Secondary | ICD-10-CM | POA: Diagnosis not present

## 2021-02-11 DIAGNOSIS — Z125 Encounter for screening for malignant neoplasm of prostate: Secondary | ICD-10-CM | POA: Diagnosis not present

## 2021-02-11 DIAGNOSIS — R0789 Other chest pain: Secondary | ICD-10-CM

## 2021-02-11 DIAGNOSIS — Z79899 Other long term (current) drug therapy: Secondary | ICD-10-CM | POA: Insufficient documentation

## 2021-02-11 DIAGNOSIS — E7439 Other disorders of intestinal carbohydrate absorption: Secondary | ICD-10-CM

## 2021-02-11 DIAGNOSIS — I10 Essential (primary) hypertension: Secondary | ICD-10-CM

## 2021-02-11 DIAGNOSIS — E782 Mixed hyperlipidemia: Secondary | ICD-10-CM | POA: Diagnosis not present

## 2021-02-11 LAB — COMPREHENSIVE METABOLIC PANEL
ALT: 99 U/L — ABNORMAL HIGH (ref 0–44)
AST: 77 U/L — ABNORMAL HIGH (ref 15–41)
Albumin: 4 g/dL (ref 3.5–5.0)
Alkaline Phosphatase: 51 U/L (ref 38–126)
Anion gap: 4 — ABNORMAL LOW (ref 5–15)
BUN: 19 mg/dL (ref 6–20)
CO2: 26 mmol/L (ref 22–32)
Calcium: 9.1 mg/dL (ref 8.9–10.3)
Chloride: 103 mmol/L (ref 98–111)
Creatinine, Ser: 0.93 mg/dL (ref 0.61–1.24)
GFR, Estimated: >60 mL/min (ref >60)
Glucose, Bld: 110 mg/dL — ABNORMAL HIGH (ref 70–99)
Potassium: 4.3 mmol/L (ref 3.5–5.1)
Sodium: 133 mmol/L — ABNORMAL LOW (ref 135–145)
Total Bilirubin: 0.9 mg/dL (ref 0.3–1.2)
Total Protein: 7.9 g/dL (ref 6.5–8.1)

## 2021-02-11 LAB — HEPATIC FUNCTION PANEL
ALT: 98 U/L — ABNORMAL HIGH (ref 0–44)
AST: 77 U/L — ABNORMAL HIGH (ref 15–41)
Albumin: 4 g/dL (ref 3.5–5.0)
Alkaline Phosphatase: 51 U/L (ref 38–126)
Bilirubin, Direct: 0.1 mg/dL (ref 0.0–0.2)
Indirect Bilirubin: 0.8 mg/dL (ref 0.3–0.9)
Total Bilirubin: 0.9 mg/dL (ref 0.3–1.2)
Total Protein: 7.9 g/dL (ref 6.5–8.1)

## 2021-02-11 LAB — HEMOGLOBIN A1C
Hgb A1c MFr Bld: 8.6 % — ABNORMAL HIGH (ref 4.8–5.6)
Mean Plasma Glucose: 200.12 mg/dL

## 2021-02-11 LAB — LIPID PANEL
Cholesterol: 110 mg/dL (ref 0–200)
HDL: 33 mg/dL — ABNORMAL LOW (ref >40)
LDL Cholesterol: 27 mg/dL (ref 0–99)
Total CHOL/HDL Ratio: 3.3 RATIO
Triglycerides: 249 mg/dL — ABNORMAL HIGH (ref <150)
VLDL: 50 mg/dL — ABNORMAL HIGH (ref 0–40)

## 2021-02-11 LAB — PSA: Prostatic Specific Antigen: 0.86 ng/mL (ref 0.00–4.00)

## 2021-02-20 ENCOUNTER — Telehealth: Payer: Self-pay | Admitting: *Deleted

## 2021-02-20 ENCOUNTER — Other Ambulatory Visit (HOSPITAL_BASED_OUTPATIENT_CLINIC_OR_DEPARTMENT_OTHER): Payer: Self-pay

## 2021-02-20 DIAGNOSIS — R7989 Other specified abnormal findings of blood chemistry: Secondary | ICD-10-CM

## 2021-02-20 DIAGNOSIS — I10 Essential (primary) hypertension: Secondary | ICD-10-CM

## 2021-02-20 DIAGNOSIS — Z79899 Other long term (current) drug therapy: Secondary | ICD-10-CM

## 2021-02-20 MED ORDER — EMPAGLIFLOZIN 10 MG PO TABS
10.0000 mg | ORAL_TABLET | Freq: Every day | ORAL | 2 refills | Status: DC
  Filled 2021-02-20 – 2021-02-25 (×2): qty 90, 90d supply, fill #0
  Filled 2021-06-03: qty 90, 90d supply, fill #1
  Filled 2021-08-27: qty 90, 90d supply, fill #2

## 2021-02-20 NOTE — Telephone Encounter (Signed)
-----   Message from Jonelle Sidle, MD sent at 02/12/2021  8:36 AM EDT ----- Results reviewed.  Follow-up hemoglobin A1c is up to 8.6% from 5.7% a year ago.  He is following with Dr. Olena Leatherwood.  Would be a reasonable candidate for Jardiance in light of his cardiac history.

## 2021-02-20 NOTE — Telephone Encounter (Signed)
Patient informed. Copy sent to PCP °

## 2021-02-20 NOTE — Telephone Encounter (Signed)
-----   Message from Jonelle Sidle, MD sent at 02/11/2021  3:27 PM EDT ----- Results reviewed.  Lipids are actually quite low with total cholesterol 110 and LDL 27 even on very low-dose Crestor.  LFTs are abnormal with AST 77 and ALT 90, this looks to be chronic however on review of old lab work going back for several years.  Has he ever seen gastroenterology for evaluation of transaminitis?

## 2021-02-20 NOTE — Telephone Encounter (Signed)
Patient informed and verbalized understanding of plan. Lab order faxed to [facility].  

## 2021-02-25 ENCOUNTER — Other Ambulatory Visit (HOSPITAL_BASED_OUTPATIENT_CLINIC_OR_DEPARTMENT_OTHER): Payer: Self-pay

## 2021-02-25 ENCOUNTER — Other Ambulatory Visit (HOSPITAL_COMMUNITY): Payer: Self-pay

## 2021-02-26 ENCOUNTER — Encounter: Payer: Self-pay | Admitting: Internal Medicine

## 2021-02-26 ENCOUNTER — Other Ambulatory Visit (HOSPITAL_COMMUNITY): Payer: Self-pay

## 2021-02-26 DIAGNOSIS — L570 Actinic keratosis: Secondary | ICD-10-CM | POA: Diagnosis not present

## 2021-02-26 DIAGNOSIS — I10 Essential (primary) hypertension: Secondary | ICD-10-CM | POA: Diagnosis not present

## 2021-02-26 DIAGNOSIS — Z6835 Body mass index (BMI) 35.0-35.9, adult: Secondary | ICD-10-CM | POA: Diagnosis not present

## 2021-02-26 DIAGNOSIS — E1169 Type 2 diabetes mellitus with other specified complication: Secondary | ICD-10-CM | POA: Diagnosis not present

## 2021-02-26 DIAGNOSIS — E7849 Other hyperlipidemia: Secondary | ICD-10-CM | POA: Diagnosis not present

## 2021-02-26 MED ORDER — BENAZEPRIL HCL 40 MG PO TABS
20.0000 mg | ORAL_TABLET | Freq: Every day | ORAL | 0 refills | Status: DC
  Filled 2021-02-26 – 2021-03-12 (×2): qty 45, 90d supply, fill #0

## 2021-02-26 MED ORDER — VALACYCLOVIR HCL 1 G PO TABS
2000.0000 mg | ORAL_TABLET | Freq: Two times a day (BID) | ORAL | 3 refills | Status: DC
  Filled 2021-02-26: qty 8, 2d supply, fill #0
  Filled 2021-07-29: qty 8, 2d supply, fill #1
  Filled 2022-02-22: qty 8, 2d supply, fill #2

## 2021-03-12 ENCOUNTER — Other Ambulatory Visit (HOSPITAL_COMMUNITY): Payer: Self-pay

## 2021-03-12 DIAGNOSIS — L509 Urticaria, unspecified: Secondary | ICD-10-CM | POA: Diagnosis not present

## 2021-03-12 DIAGNOSIS — Z6835 Body mass index (BMI) 35.0-35.9, adult: Secondary | ICD-10-CM | POA: Diagnosis not present

## 2021-03-12 MED ORDER — TADALAFIL 5 MG PO TABS
5.0000 mg | ORAL_TABLET | Freq: Every day | ORAL | 0 refills | Status: DC
  Filled 2021-03-12: qty 90, 90d supply, fill #0

## 2021-03-12 MED ORDER — FAMOTIDINE 20 MG PO TABS
20.0000 mg | ORAL_TABLET | Freq: Every day | ORAL | 0 refills | Status: DC
  Filled 2021-03-12: qty 20, 20d supply, fill #0

## 2021-03-12 MED ORDER — HYDROXYZINE PAMOATE 25 MG PO CAPS
25.0000 mg | ORAL_CAPSULE | Freq: Three times a day (TID) | ORAL | 0 refills | Status: DC | PRN
  Filled 2021-03-12: qty 30, 10d supply, fill #0

## 2021-03-12 MED ORDER — METHYLPREDNISOLONE 4 MG PO TBPK
ORAL_TABLET | ORAL | 0 refills | Status: DC
  Filled 2021-03-12: qty 21, 6d supply, fill #0

## 2021-06-01 ENCOUNTER — Telehealth: Payer: 59 | Admitting: Nurse Practitioner

## 2021-06-01 DIAGNOSIS — U071 COVID-19: Secondary | ICD-10-CM

## 2021-06-01 MED ORDER — NIRMATRELVIR/RITONAVIR (PAXLOVID)TABLET: 3.0000 | ORAL_TABLET | Freq: Two times a day (BID) | ORAL | 0 refills | Status: AC

## 2021-06-01 MED ORDER — BENZONATATE 100 MG PO CAPS: 100.0000 mg | ORAL_CAPSULE | Freq: Three times a day (TID) | ORAL | 0 refills | Status: AC | PRN

## 2021-06-01 MED ORDER — ALBUTEROL SULFATE HFA 108 (90 BASE) MCG/ACT IN AERS: 2.0000 | INHALATION_SPRAY | Freq: Four times a day (QID) | RESPIRATORY_TRACT | 0 refills | Status: DC | PRN

## 2021-06-01 NOTE — Progress Notes (Signed)
Virtual Visit Consent   Blake Mcmillan, you are scheduled for a virtual visit with a [facility] provider today.     Just as with appointments in the office, your consent must be obtained to participate.  Your consent will be active for this visit and any virtual visit you may have with one of our providers in the next 365 days.     If you have a MyChart account, a copy of this consent can be sent to you electronically.  All virtual visits are billed to your insurance company just like a traditional visit in the office.    As this is a virtual visit, video technology does not allow for your provider to perform a traditional examination.  This may limit your provider's ability to fully assess your condition.  If your provider identifies any concerns that need to be evaluated in person or the need to arrange testing (such as labs, EKG, etc.), we will make arrangements to do so.     Although advances in technology are sophisticated, we cannot ensure that it will always work on either your end or our end.  If the connection with a video visit is poor, the visit may have to be switched to a telephone visit.  With either a video or telephone visit, we are not always able to ensure that we have a secure connection.     I need to obtain your verbal consent now.   Are you willing to proceed with your visit today?    [name] has provided verbal consent on 06/01/2021 for a virtual visit (video or telephone).   [name], FNP   Date: 06/01/2021 8:42 AM   Virtual Visit via Video Note   I, [name], connected with  Blake Mcmillan  ([id number], [birth year]) on 06/01/21 at  8:45 AM EST by a video-enabled telemedicine application and verified that I am speaking with the correct person using two identifiers.  Location: Patient: Virtual Visit Location Patient: Home Provider: Virtual Visit Location Provider: Home Office   I discussed the limitations of evaluation and management by  telemedicine and the availability of in person appointments. The patient expressed understanding and agreed to proceed.    History of Present Illness: Blake Mcmillan is a 61 y.o. who identifies as a male who was assigned male at birth, and is being seen today for cough and tightness in his chest with deep breaths and sneezing. He has also been having body aches.   His symptoms started 2 days ago, took a home COVID test last night and was positive.   He has not had COVID in the past  He has been vaccinated for COVID with a booster this fall    GFR in September of 2022 > 60  Denies a history of kidney disease   Has used inhalers for URI symptoms in the past   Denies SOB at this time mild wheezing  Denies chest pain    Problems:  Patient Active Problem List   Diagnosis Date Noted   Unstable angina (HCC) 08/22/2016   Abnormal stress test 08/22/2016   Patellar clunk syndrome of left knee 02/01/2014   Postop Hyponatremia 05/15/2011    Allergies: No Known Allergies Medications:   Current Outpatient Medications  Medication Instructions   aspirin 81 MG EC tablet Oral   aspirin EC 81 mg, Every morning   benazepril (LOTENSIN) 20 mg, Oral, Daily   benzonatate (TESSALON) 100 mg, Oral, Every 8 hours  cetirizine (ZYRTEC) 10 mg, Oral, Daily   diphenhydrAMINE (BENADRYL) 50 mg, Oral, At bedtime PRN   fluticasone (FLONASE) 50 MCG/ACT nasal spray Nasal   ibuprofen (ADVIL) 800 MG tablet TAKE 1 TABLET BY MOUTH EVERY 8 HOURS AS NEEDED.   ipratropium (ATROVENT) 0.06 % nasal spray 2 sprays by Each Nare route Three (3) times a day.   Jardiance 10 mg, Oral, Daily before breakfast   meclizine (ANTIVERT) 25 MG tablet Oral   naproxen sodium (ALEVE) 110 mg, Oral, Every morning   nitroGLYCERIN (NITROSTAT) 0.4 mg, Sublingual, Every 5 min PRN   tadalafil (CIALIS) 5 mg, Oral, Daily   valACYclovir (VALTREX) 1000 MG tablet 2 g ORALLY twice daily for 1 day; separate doses by 12 hours; start at the  earliest onset of symptoms   valACYclovir (VALTREX) 2,000 mg, Oral, Every 12 hours     Observations/Objective: Patient is well-developed, well-nourished in no acute distress.  Resting comfortably  at home.  Head is normocephalic, atraumatic.  No labored breathing.  Speech is clear and coherent with logical content.  Patient is alert and oriented at baseline.    Assessment and Plan: 1. COVID-19 Hold Cialis and Flonase while on anti-viral  Take Anti-viral with food   - nirmatrelvir/ritonavir EUA (PAXLOVID) 20 x 150 MG & 10 x 100MG  TABS; Take 3 tablets by mouth 2 (two) times daily for 5 days. (Take nirmatrelvir 150 mg two tablets twice daily for 5 days and ritonavir 100 mg one tablet twice daily for 5 days) Patient GFR is >60  Dispense: 30 tablet; Refill: 0 - benzonatate (TESSALON) 100 MG capsule; Take 1 capsule (100 mg total) by mouth 3 (three) times daily as needed for up to 10 days for cough (1-2 capsules every 8 hours as needed. UP to 6 max in 24 hours.).  Dispense: 45 capsule; Refill: 0 - albuterol (VENTOLIN HFA) 108 (90 Base) MCG/ACT inhaler; Inhale 2 puffs into the lungs every 6 (six) hours as needed for wheezing or shortness of breath.  Dispense: 8 g; Refill: 0     Follow Up Instructions: I discussed the assessment and treatment plan with the patient. The patient was provided an opportunity to ask questions and all were answered. The patient agreed with the plan and demonstrated an understanding of the instructions.  A copy of instructions were sent to the patient via MyChart unless otherwise noted below.     The patient was advised to call back or seek an in-person evaluation if the symptoms worsen or if the condition fails to improve as anticipated.  Time:  I spent 15 minutes with the patient via telehealth technology discussing the above problems/concerns.    , FNP

## 2021-06-01 NOTE — Patient Instructions (Signed)
You are being prescribed PAXLOVID for COVID-19 infection.    Please call the pharmacy or go through the drive through vs going inside if you are picking up the mediation yourself to prevent further spread. If prescribed to a [facility] affiliated pharmacy, a pharmacist will bring the medication out to your car.   Medications to hold while taking this treatment: Cialis & Flonase  *If asked to hold, you can resume them 24 hours after your last dose   ADMINISTRATION INSTRUCTIONS: Take with or without food. Swallow the tablets whole. Don't chew, crush, or break the medications because it might not work as well  Take the medication TWICE a day for FIVE days   Finish your full five-day course of Paxlovid even if you feel better before you're done. Stopping this medication too early can make it less effective to prevent severe illness related to COVID19.    Paxlovid is prescribed for YOU ONLY. Don't share it with others, even if they have similar symptoms as you. This medication might not be right for everyone.  Make sure to take steps to protect yourself and others while you're taking this medication in order to get well soon and to prevent others from getting sick with COVID-19.  Paxlovid (nirmatrelvir / ritonavir) can cause hormonal birth control medications to not work well. If you or your partner is currently taking hormonal birth control, use condoms or other birth control methods to prevent unintended pregnancies.    COMMON SIDE EFFECTS: Altered or bad taste in your mouth  Diarrhea  High blood pressure (1% of people) Muscle aches (1% of people)     If your COVID-19 symptoms get worse, get medical help right away. Call 911 if you experience symptoms such as worsening cough, trouble breathing, chest pain that doesn't go away, confusion, a hard time staying awake, and pale or blue-colored skin. This medication won't prevent all COVID-19 cases from getting worse.

## 2021-06-04 ENCOUNTER — Other Ambulatory Visit (HOSPITAL_COMMUNITY): Payer: Self-pay

## 2021-06-06 ENCOUNTER — Other Ambulatory Visit (HOSPITAL_COMMUNITY): Payer: Self-pay

## 2021-06-06 ENCOUNTER — Other Ambulatory Visit: Payer: Self-pay

## 2021-06-06 ENCOUNTER — Other Ambulatory Visit (HOSPITAL_COMMUNITY)
Admission: RE | Admit: 2021-06-06 | Discharge: 2021-06-06 | Disposition: A | Discharge status: Home / Self Care | Payer: 59 | Source: Ambulatory Visit | Attending: Orthopaedic Surgery | Admitting: Orthopaedic Surgery

## 2021-06-06 DIAGNOSIS — M25562 Pain in left knee: Secondary | ICD-10-CM | POA: Diagnosis not present

## 2021-06-06 DIAGNOSIS — G8929 Other chronic pain: Secondary | ICD-10-CM | POA: Insufficient documentation

## 2021-06-06 LAB — C-REACTIVE PROTEIN: CRP: 0.9 mg/dL (ref <1.0)

## 2021-06-06 LAB — SEDIMENTATION RATE: Sed Rate: 4 mm/hr (ref 0–16)

## 2021-06-11 ENCOUNTER — Other Ambulatory Visit (HOSPITAL_COMMUNITY): Payer: Self-pay

## 2021-07-02 ENCOUNTER — Other Ambulatory Visit (HOSPITAL_COMMUNITY): Payer: Self-pay

## 2021-07-02 ENCOUNTER — Other Ambulatory Visit (HOSPITAL_COMMUNITY)
Admission: RE | Admit: 2021-07-02 | Discharge: 2021-07-02 | Disposition: A | Discharge status: Home / Self Care | Payer: 59 | Source: Ambulatory Visit | Attending: Internal Medicine | Admitting: Internal Medicine

## 2021-07-02 DIAGNOSIS — Z6834 Body mass index (BMI) 34.0-34.9, adult: Secondary | ICD-10-CM | POA: Diagnosis not present

## 2021-07-02 DIAGNOSIS — E1169 Type 2 diabetes mellitus with other specified complication: Secondary | ICD-10-CM | POA: Diagnosis not present

## 2021-07-02 DIAGNOSIS — E1165 Type 2 diabetes mellitus with hyperglycemia: Secondary | ICD-10-CM | POA: Diagnosis not present

## 2021-07-02 DIAGNOSIS — Z125 Encounter for screening for malignant neoplasm of prostate: Secondary | ICD-10-CM | POA: Insufficient documentation

## 2021-07-02 DIAGNOSIS — I10 Essential (primary) hypertension: Secondary | ICD-10-CM | POA: Insufficient documentation

## 2021-07-02 DIAGNOSIS — E782 Mixed hyperlipidemia: Secondary | ICD-10-CM | POA: Insufficient documentation

## 2021-07-02 LAB — BASIC METABOLIC PANEL
Anion gap: 7 (ref 5–15)
BUN: 14 mg/dL (ref 6–20)
CO2: 24 mmol/L (ref 22–32)
Calcium: 9.2 mg/dL (ref 8.9–10.3)
Chloride: 104 mmol/L (ref 98–111)
Creatinine, Ser: 0.9 mg/dL (ref 0.61–1.24)
GFR, Estimated: >60 mL/min (ref >60)
Glucose, Bld: 118 mg/dL — ABNORMAL HIGH (ref 70–99)
Potassium: 4.2 mmol/L (ref 3.5–5.1)
Sodium: 135 mmol/L (ref 135–145)

## 2021-07-02 LAB — LIPID PANEL
Cholesterol: 126 mg/dL (ref 0–200)
HDL: 41 mg/dL (ref >40)
LDL Cholesterol: 74 mg/dL (ref 0–99)
Total CHOL/HDL Ratio: 3.1 RATIO
Triglycerides: 56 mg/dL (ref <150)
VLDL: 11 mg/dL (ref 0–40)

## 2021-07-02 LAB — PSA: Prostatic Specific Antigen: 0.86 ng/mL (ref 0.00–4.00)

## 2021-07-02 MED ORDER — JARDIANCE 10 MG PO TABS
10.0000 mg | ORAL_TABLET | Freq: Every day | ORAL | 0 refills | Status: DC
  Filled 2021-07-02 – 2022-06-02 (×3): qty 90, 90d supply, fill #0

## 2021-07-02 MED ORDER — VALACYCLOVIR HCL 1 G PO TABS
2000.0000 mg | ORAL_TABLET | Freq: Two times a day (BID) | ORAL | 3 refills | Status: DC
  Filled 2021-07-02 – 2022-05-28 (×3): qty 8, 2d supply, fill #0

## 2021-07-02 MED ORDER — BENAZEPRIL HCL 40 MG PO TABS
20.0000 mg | ORAL_TABLET | Freq: Every day | ORAL | 0 refills | Status: DC
  Filled 2021-07-02: qty 45, 90d supply, fill #0

## 2021-07-03 LAB — HEMOGLOBIN A1C
Hgb A1c MFr Bld: 6.2 % — ABNORMAL HIGH (ref 4.8–5.6)
Mean Plasma Glucose: 131 mg/dL

## 2021-07-13 NOTE — Progress Notes (Unsigned)
Referring Provider: Satira Sark, MD Primary Care Physician:  Neale Burly, MD Primary Gastroenterologist:  Dr. Gala Romney  No chief complaint on file.   HPI:   Blake Mcmillan is a 61 y.o. male presenting today at the request of Satira Sark, MD for elevated LFTs. Referral was placed in September 2022. In September AST 77, ALT 99, alk phos and t bili within normal limits. Prior to this, his last chemistry panel on file in [place] was in 2019 with slight elevation of ALT at 46, AST, alk phos, and t bili wnl. In 2012 with AST 78, ALT 104, alk phos and t bili wnl.   Today:    He was started on Crestor in March. 2022. ? D/C in September.   Past Medical History:  Diagnosis Date   CAD (coronary artery disease)    90% septal branch managed medically 2018   Depression    Essential hypertension    GERD (gastroesophageal reflux disease)    Hypertrophic synovitis    Left knee   Osteoarthritis    Seasonal allergies     Past Surgical History:  Procedure Laterality Date   CARPAL TUNNEL RELEASE  05-09-11   Bilaterally '02   HEMORRHOID SURGERY  05-09-11   '96   KNEE ARTHROSCOPY     RT KNEE   KNEE ARTHROSCOPY Left 02/01/2014   Procedure: LEFT KNEE ARTHROSCOPY WITH SYNOVECTOMY ;  Surgeon: Gearlean Alf, MD;  Location: [facility];  Service: Orthopedics;  Laterality: Left;   LEFT HEART CATH AND CORONARY ANGIOGRAPHY N/A 08/22/2016   Procedure: Left Heart Cath and Coronary Angiography;  Surgeon: Nelva Bush, MD;  Location: [facility];  Service: Cardiovascular;  Laterality: N/A;   NASAL SINUS SURGERY  05-09-11   2'12 Carlena Bjornstad  05-09-11   '97   TOTAL KNEE ARTHROPLASTY  05/12/2011   Procedure: TOTAL KNEE ARTHROPLASTY;  Surgeon: Gearlean Alf;  Location: [facility];  Service: Orthopedics;  Laterality: Left;   VASECTOMY  05-09-11   '07    Current Outpatient Medications  Medication Sig Dispense Refill   albuterol (VENTOLIN HFA) 108 (90 Base)  MCG/ACT inhaler Inhale 2 puffs into the lungs every 6 (six) hours as needed for wheezing or shortness of breath. 8 g 0   aspirin 81 MG EC tablet Take by mouth.     aspirin EC 81 MG tablet Take 81 mg by mouth every morning.     benazepril (LOTENSIN) 40 MG tablet Take 0.5 tablets (20 mg total) by mouth daily. 45 tablet 0   benazepril (LOTENSIN) 40 MG tablet Take 0.5 tablets (20 mg total) by mouth daily. 45 tablet 0   benzonatate (TESSALON) 100 MG capsule Take 1 capsule (100 mg total) by mouth every 8 (eight) hours. 21 capsule 0   cetirizine (ZYRTEC) 10 MG tablet Take 1 tablet (10 mg total) by mouth daily. 30 tablet 0   diphenhydrAMINE (BENADRYL) 50 MG capsule Take 50 mg by mouth at bedtime as needed.     empagliflozin (JARDIANCE) 10 MG TABS tablet Take 1 tablet (10 mg total) by mouth daily before breakfast. 90 tablet 2   empagliflozin (JARDIANCE) 10 MG TABS tablet Take 1 tablet (10 mg total) by mouth daily. 90 tablet 0   fluticasone (FLONASE) 50 MCG/ACT nasal spray Place into the nose.     ipratropium (ATROVENT) 0.06 % nasal spray 2 sprays by Each Nare route Three (3) times a day.     meclizine (ANTIVERT)  25 MG tablet Take by mouth.     naproxen sodium (ANAPROX) 220 MG tablet Take 110 mg by mouth every morning.      nitroGLYCERIN (NITROSTAT) 0.4 MG SL tablet Place 0.4 mg under the tongue every 5 (five) minutes as needed for chest pain.     tadalafil (CIALIS) 5 MG tablet Take 1 tablet (5 mg total) by mouth daily. 90 tablet 0   valACYclovir (VALTREX) 1000 MG tablet 2 g ORALLY twice daily for 1 day; separate doses by 12 hours; start at the earliest onset of symptoms 4 tablet 0   valACYclovir (VALTREX) 1000 MG tablet Take 2 tablets (2,000 mg total) by mouth every 12 (twelve) hours. 8 tablet 3   valACYclovir (VALTREX) 1000 MG tablet Take 2 tablets (2,000 mg total) by mouth every 12 (twelve) hours. 8 tablet 3   No current facility-administered medications for this visit.    Allergies as of 07/15/2021    (No Known Allergies)    Family History  Problem Relation Age of Onset   Hypertension Mother    Heart disease Father    Heart attack Father    Heart disease Brother    Heart attack Brother    Heart attack Maternal Grandfather     Social History   Socioeconomic History   Marital status: Married    Spouse name: Not on file   Number of children: Not on file   Years of education: Not on file   Highest education level: Not on file  Occupational History   Not on file  Tobacco Use   Smoking status: Former    Types: Cigarettes    Quit date: 06/08/1992    Years since quitting: 29.1   Smokeless tobacco: Never  Substance and Sexual Activity   Alcohol use: No   Drug use: No   Sexual activity: Not on file  Other Topics Concern   Not on file  Social History Narrative   Not on file   Social Determinants of Health   Financial Resource Strain: Not on file  Food Insecurity: Not on file  Transportation Needs: Not on file  Physical Activity: Not on file  Stress: Not on file  Social Connections: Not on file  Intimate Partner Violence: Not on file    Review of Systems: Gen: Denies any fever, chills, fatigue, weight loss, lack of appetite.  CV: Denies chest pain, heart palpitations, peripheral edema, syncope.  Resp: Denies shortness of breath at rest or with exertion. Denies wheezing or cough.  GI: Denies dysphagia or odynophagia. Denies jaundice, hematemesis, fecal incontinence. GU : Denies urinary burning, urinary frequency, urinary hesitancy MS: Denies joint pain, muscle weakness, cramps, or limitation of movement.  Derm: Denies rash, itching, dry skin Psych: Denies depression, anxiety, memory loss, and confusion Heme: Denies bruising, bleeding, and enlarged lymph nodes.  Physical Exam: There were no vitals taken for this visit. General:   Alert and oriented. Pleasant and cooperative. Well-nourished and well-developed.  Head:  Normocephalic and atraumatic. Eyes:  Without  icterus, sclera clear and conjunctiva pink.  Ears:  Normal auditory acuity. Nose:  No deformity, discharge,  or lesions. Mouth:  No deformity or lesions, oral mucosa pink.  Neck:  Supple, without mass or thyromegaly. Lungs:  Clear to auscultation bilaterally. No wheezes, rales, or rhonchi. No distress.  Heart:  S1, S2 present without murmurs appreciated.  Abdomen:  +BS, soft, non-tender and non-distended. No HSM noted. No guarding or rebound. No masses appreciated.  Rectal:  Deferred  Msk:  Symmetrical without gross deformities. Normal posture. Pulses:  Normal pulses noted. Extremities:  Without clubbing or edema. Neurologic:  Alert and  oriented x4;  grossly normal neurologically. Skin:  Intact without significant lesions or rashes. Cervical Nodes:  No significant cervical adenopathy. Psych:  Alert and cooperative. Normal mood and affect.

## 2021-07-14 ENCOUNTER — Encounter (INDEPENDENT_AMBULATORY_CARE_PROVIDER_SITE_OTHER): Payer: Self-pay

## 2021-07-15 ENCOUNTER — Ambulatory Visit: Payer: 59 | Admitting: Gastroenterology

## 2021-07-29 ENCOUNTER — Other Ambulatory Visit (HOSPITAL_COMMUNITY): Payer: Self-pay

## 2021-07-29 MED ORDER — BENAZEPRIL HCL 40 MG PO TABS
20.0000 mg | ORAL_TABLET | Freq: Every day | ORAL | 0 refills | Status: DC
  Filled 2021-07-29 – 2022-05-28 (×2): qty 45, 90d supply, fill #0

## 2021-07-29 MED ORDER — TADALAFIL 5 MG PO TABS
5.0000 mg | ORAL_TABLET | Freq: Every day | ORAL | 0 refills | Status: DC
  Filled 2021-07-29 – 2022-02-22 (×2): qty 90, 90d supply, fill #0

## 2021-07-30 ENCOUNTER — Other Ambulatory Visit (HOSPITAL_COMMUNITY): Payer: Self-pay

## 2021-07-31 ENCOUNTER — Other Ambulatory Visit (HOSPITAL_COMMUNITY): Payer: Self-pay

## 2021-08-01 ENCOUNTER — Other Ambulatory Visit (HOSPITAL_COMMUNITY): Payer: Self-pay

## 2021-08-07 ENCOUNTER — Other Ambulatory Visit (HOSPITAL_COMMUNITY): Payer: Self-pay

## 2021-08-08 ENCOUNTER — Other Ambulatory Visit (HOSPITAL_COMMUNITY): Payer: Self-pay

## 2021-08-20 ENCOUNTER — Other Ambulatory Visit (HOSPITAL_COMMUNITY): Payer: Self-pay

## 2021-08-27 ENCOUNTER — Other Ambulatory Visit (HOSPITAL_COMMUNITY): Payer: Self-pay

## 2021-08-27 MED ORDER — BENAZEPRIL HCL 40 MG PO TABS
20.0000 mg | ORAL_TABLET | Freq: Every day | ORAL | 0 refills | Status: DC
  Filled 2021-08-27 – 2022-02-22 (×2): qty 45, 90d supply, fill #0

## 2021-10-29 ENCOUNTER — Other Ambulatory Visit (HOSPITAL_COMMUNITY): Payer: Self-pay

## 2021-10-29 DIAGNOSIS — Z6835 Body mass index (BMI) 35.0-35.9, adult: Secondary | ICD-10-CM | POA: Diagnosis not present

## 2021-10-29 DIAGNOSIS — M25562 Pain in left knee: Secondary | ICD-10-CM | POA: Diagnosis not present

## 2021-10-29 DIAGNOSIS — E1169 Type 2 diabetes mellitus with other specified complication: Secondary | ICD-10-CM | POA: Diagnosis not present

## 2021-10-29 DIAGNOSIS — I10 Essential (primary) hypertension: Secondary | ICD-10-CM | POA: Diagnosis not present

## 2021-10-29 DIAGNOSIS — J309 Allergic rhinitis, unspecified: Secondary | ICD-10-CM | POA: Diagnosis not present

## 2021-10-29 DIAGNOSIS — E7849 Other hyperlipidemia: Secondary | ICD-10-CM | POA: Diagnosis not present

## 2021-10-29 MED ORDER — BENAZEPRIL HCL 40 MG PO TABS
20.0000 mg | ORAL_TABLET | Freq: Every day | ORAL | 0 refills | Status: DC
  Filled 2021-10-29: qty 45, 90d supply, fill #0

## 2021-10-29 MED ORDER — VALACYCLOVIR HCL 1 G PO TABS
2000.0000 mg | ORAL_TABLET | Freq: Two times a day (BID) | ORAL | 3 refills | Status: DC
  Filled 2021-10-29: qty 8, 2d supply, fill #0
  Filled 2022-07-25: qty 8, 2d supply, fill #1
  Filled 2022-08-20: qty 8, 2d supply, fill #2

## 2021-10-29 MED ORDER — JARDIANCE 10 MG PO TABS
10.0000 mg | ORAL_TABLET | Freq: Every day | ORAL | 0 refills | Status: DC
  Filled 2021-10-29: qty 90, 90d supply, fill #0

## 2021-10-30 ENCOUNTER — Other Ambulatory Visit (HOSPITAL_COMMUNITY): Payer: Self-pay

## 2021-11-05 ENCOUNTER — Other Ambulatory Visit (HOSPITAL_COMMUNITY): Payer: Self-pay

## 2021-11-07 ENCOUNTER — Other Ambulatory Visit (HOSPITAL_COMMUNITY): Payer: Self-pay

## 2021-11-29 DIAGNOSIS — J988 Other specified respiratory disorders: Secondary | ICD-10-CM | POA: Diagnosis not present

## 2021-11-29 DIAGNOSIS — J019 Acute sinusitis, unspecified: Secondary | ICD-10-CM | POA: Diagnosis not present

## 2021-11-29 DIAGNOSIS — B9689 Other specified bacterial agents as the cause of diseases classified elsewhere: Secondary | ICD-10-CM | POA: Diagnosis not present

## 2021-11-29 DIAGNOSIS — R051 Acute cough: Secondary | ICD-10-CM | POA: Diagnosis not present

## 2022-02-22 ENCOUNTER — Other Ambulatory Visit (HOSPITAL_COMMUNITY): Payer: Self-pay

## 2022-02-24 ENCOUNTER — Other Ambulatory Visit (HOSPITAL_COMMUNITY): Payer: Self-pay

## 2022-02-25 ENCOUNTER — Other Ambulatory Visit (HOSPITAL_COMMUNITY): Payer: Self-pay

## 2022-03-04 ENCOUNTER — Other Ambulatory Visit (HOSPITAL_COMMUNITY): Payer: Self-pay

## 2022-03-04 DIAGNOSIS — M25562 Pain in left knee: Secondary | ICD-10-CM | POA: Diagnosis not present

## 2022-03-04 DIAGNOSIS — Z6835 Body mass index (BMI) 35.0-35.9, adult: Secondary | ICD-10-CM | POA: Diagnosis not present

## 2022-03-04 DIAGNOSIS — E7849 Other hyperlipidemia: Secondary | ICD-10-CM | POA: Diagnosis not present

## 2022-03-04 DIAGNOSIS — I10 Essential (primary) hypertension: Secondary | ICD-10-CM | POA: Diagnosis not present

## 2022-03-04 DIAGNOSIS — J0191 Acute recurrent sinusitis, unspecified: Secondary | ICD-10-CM | POA: Diagnosis not present

## 2022-03-04 DIAGNOSIS — E1169 Type 2 diabetes mellitus with other specified complication: Secondary | ICD-10-CM | POA: Diagnosis not present

## 2022-03-04 DIAGNOSIS — J309 Allergic rhinitis, unspecified: Secondary | ICD-10-CM | POA: Diagnosis not present

## 2022-03-04 MED ORDER — JARDIANCE 10 MG PO TABS
10.0000 mg | ORAL_TABLET | Freq: Every day | ORAL | 0 refills | Status: DC
  Filled 2022-03-04: qty 90, 90d supply, fill #0

## 2022-03-04 MED ORDER — CEFUROXIME AXETIL 500 MG PO TABS
500.0000 mg | ORAL_TABLET | Freq: Two times a day (BID) | ORAL | 0 refills | Status: DC
  Filled 2022-03-04: qty 10, 5d supply, fill #0
  Filled 2022-03-04: qty 4, 2d supply, fill #0

## 2022-03-04 MED ORDER — PROMETHAZINE-DM 6.25-15 MG/5ML PO SYRP
5.0000 mL | ORAL_SOLUTION | Freq: Four times a day (QID) | ORAL | 0 refills | Status: DC | PRN
  Filled 2022-03-04: qty 90, 5d supply, fill #0

## 2022-03-04 MED ORDER — BENAZEPRIL HCL 40 MG PO TABS
20.0000 mg | ORAL_TABLET | Freq: Every day | ORAL | 0 refills | Status: DC
  Filled 2022-03-04 – 2022-08-20 (×2): qty 45, 90d supply, fill #0

## 2022-03-04 MED ORDER — PROMETHAZINE-CODEINE 6.25-10 MG/5ML PO SOLN
5.0000 mL | ORAL | 1 refills | Status: DC | PRN
  Filled 2022-03-04: qty 90, 3d supply, fill #0
  Filled 2022-05-28 – 2022-06-07 (×3): qty 90, fill #0

## 2022-04-02 ENCOUNTER — Other Ambulatory Visit (HOSPITAL_COMMUNITY): Payer: Self-pay

## 2022-04-02 MED ORDER — VALACYCLOVIR HCL 1 G PO TABS
2000.0000 mg | ORAL_TABLET | Freq: Two times a day (BID) | ORAL | 3 refills | Status: DC
  Filled 2022-04-02: qty 8, 2d supply, fill #0
  Filled 2022-07-25 – 2022-11-21 (×3): qty 8, 2d supply, fill #1
  Filled 2023-02-15: qty 8, 2d supply, fill #2

## 2022-05-12 ENCOUNTER — Other Ambulatory Visit (HOSPITAL_COMMUNITY)
Admission: RE | Admit: 2022-05-12 | Discharge: 2022-05-12 | Disposition: A | Discharge status: Home / Self Care | Payer: 59 | Source: Ambulatory Visit | Attending: Internal Medicine | Admitting: Internal Medicine

## 2022-05-12 DIAGNOSIS — E7849 Other hyperlipidemia: Secondary | ICD-10-CM | POA: Diagnosis not present

## 2022-05-12 LAB — LIPID PANEL
Cholesterol: 130 mg/dL (ref 0–200)
HDL: 41 mg/dL (ref >40)
LDL Cholesterol: 74 mg/dL (ref 0–99)
Total CHOL/HDL Ratio: 3.2 RATIO
Triglycerides: 77 mg/dL (ref <150)
VLDL: 15 mg/dL (ref 0–40)

## 2022-05-12 LAB — BASIC METABOLIC PANEL
Anion gap: 7 (ref 5–15)
BUN: 17 mg/dL (ref 8–23)
CO2: 26 mmol/L (ref 22–32)
Calcium: 9.4 mg/dL (ref 8.9–10.3)
Chloride: 103 mmol/L (ref 98–111)
Creatinine, Ser: 0.88 mg/dL (ref 0.61–1.24)
GFR, Estimated: >60 mL/min (ref >60)
Glucose, Bld: 145 mg/dL — ABNORMAL HIGH (ref 70–99)
Potassium: 4.4 mmol/L (ref 3.5–5.1)
Sodium: 136 mmol/L (ref 135–145)

## 2022-05-12 LAB — PSA: Prostatic Specific Antigen: 1.06 ng/mL (ref 0.00–4.00)

## 2022-05-13 LAB — HEMOGLOBIN A1C
Hgb A1c MFr Bld: 6.7 % — ABNORMAL HIGH (ref 4.8–5.6)
Mean Plasma Glucose: 146 mg/dL

## 2022-05-28 ENCOUNTER — Other Ambulatory Visit: Payer: Self-pay

## 2022-05-28 ENCOUNTER — Other Ambulatory Visit (HOSPITAL_COMMUNITY): Payer: Self-pay

## 2022-05-28 MED ORDER — TADALAFIL 5 MG PO TABS
5.0000 mg | ORAL_TABLET | Freq: Every day | ORAL | 0 refills | Status: DC
  Filled 2022-05-28: qty 90, 90d supply, fill #0

## 2022-05-29 ENCOUNTER — Other Ambulatory Visit: Payer: Self-pay

## 2022-06-02 ENCOUNTER — Other Ambulatory Visit: Payer: Self-pay

## 2022-06-07 ENCOUNTER — Other Ambulatory Visit (HOSPITAL_COMMUNITY): Payer: Self-pay

## 2022-07-24 ENCOUNTER — Encounter: Payer: Self-pay | Admitting: Radiology

## 2022-07-25 ENCOUNTER — Other Ambulatory Visit (HOSPITAL_COMMUNITY): Payer: Self-pay

## 2022-07-26 ENCOUNTER — Other Ambulatory Visit (HOSPITAL_COMMUNITY): Payer: Self-pay

## 2022-07-28 ENCOUNTER — Other Ambulatory Visit: Payer: Self-pay

## 2022-08-20 ENCOUNTER — Other Ambulatory Visit (HOSPITAL_COMMUNITY): Payer: Self-pay

## 2022-08-21 ENCOUNTER — Other Ambulatory Visit (HOSPITAL_COMMUNITY): Payer: Self-pay

## 2022-08-21 ENCOUNTER — Other Ambulatory Visit: Payer: Self-pay

## 2022-08-21 MED ORDER — TADALAFIL 5 MG PO TABS
5.0000 mg | ORAL_TABLET | Freq: Every day | ORAL | 0 refills | Status: DC
  Filled 2022-08-21: qty 90, 90d supply, fill #0

## 2022-08-21 MED ORDER — JARDIANCE 10 MG PO TABS
10.0000 mg | ORAL_TABLET | Freq: Every day | ORAL | 0 refills | Status: DC
  Filled 2022-08-21: qty 90, 90d supply, fill #0

## 2022-09-01 ENCOUNTER — Other Ambulatory Visit (HOSPITAL_COMMUNITY): Payer: Self-pay

## 2022-09-01 MED ORDER — BENAZEPRIL HCL 40 MG PO TABS
20.0000 mg | ORAL_TABLET | Freq: Every day | ORAL | 0 refills | Status: DC
  Filled 2022-09-01 – 2023-02-15 (×2): qty 45, 90d supply, fill #0

## 2022-09-01 MED ORDER — BENAZEPRIL HCL 40 MG PO TABS
20.0000 mg | ORAL_TABLET | Freq: Every day | ORAL | 0 refills | Status: DC
  Filled 2022-11-21: qty 45, 90d supply, fill #0

## 2022-09-01 MED ORDER — BELSOMRA 5 MG PO TABS
5.0000 mg | ORAL_TABLET | Freq: Every day | ORAL | 0 refills | Status: DC
  Filled 2022-09-01: qty 30, 30d supply, fill #0

## 2022-09-02 ENCOUNTER — Other Ambulatory Visit (HOSPITAL_COMMUNITY): Payer: Self-pay

## 2022-09-02 ENCOUNTER — Encounter (INDEPENDENT_AMBULATORY_CARE_PROVIDER_SITE_OTHER): Payer: Self-pay | Admitting: *Deleted

## 2022-09-02 MED ORDER — BELSOMRA 5 MG PO TABS
5.0000 mg | ORAL_TABLET | Freq: Every day | ORAL | 0 refills | Status: DC
  Filled 2022-09-02: qty 30, 30d supply, fill #0
  Filled 2022-11-21: qty 30, 30d supply, fill #1

## 2022-09-03 ENCOUNTER — Encounter: Payer: Self-pay | Admitting: Pharmacist

## 2022-09-03 ENCOUNTER — Other Ambulatory Visit: Payer: Self-pay

## 2022-09-03 ENCOUNTER — Other Ambulatory Visit (HOSPITAL_COMMUNITY): Payer: Self-pay

## 2022-09-18 ENCOUNTER — Other Ambulatory Visit (HOSPITAL_COMMUNITY)
Admission: RE | Admit: 2022-09-18 | Discharge: 2022-09-18 | Disposition: A | Discharge status: Home / Self Care | Payer: Commercial Managed Care - PPO | Source: Ambulatory Visit | Attending: Internal Medicine | Admitting: Internal Medicine

## 2022-09-18 DIAGNOSIS — R5383 Other fatigue: Secondary | ICD-10-CM | POA: Insufficient documentation

## 2022-09-18 LAB — CBC
HCT: 53 % — ABNORMAL HIGH (ref 39.0–52.0)
Hemoglobin: 17.8 g/dL — ABNORMAL HIGH (ref 13.0–17.0)
MCH: 29.8 pg (ref 26.0–34.0)
MCHC: 33.6 g/dL (ref 30.0–36.0)
MCV: 88.6 fL (ref 80.0–100.0)
Platelets: 228 10*3/uL (ref 150–400)
RBC: 5.98 MIL/uL — ABNORMAL HIGH (ref 4.22–5.81)
RDW: 13.2 % (ref 11.5–15.5)
WBC: 10.7 10*3/uL — ABNORMAL HIGH (ref 4.0–10.5)
nRBC: 0 % (ref 0.0–0.2)

## 2022-09-18 LAB — HEMOGLOBIN A1C
Hgb A1c MFr Bld: 6.7 % — ABNORMAL HIGH (ref 4.8–5.6)
Mean Plasma Glucose: 145.59 mg/dL

## 2022-09-18 LAB — COMPREHENSIVE METABOLIC PANEL
ALT: 43 U/L (ref 0–44)
AST: 35 U/L (ref 15–41)
Albumin: 3.8 g/dL (ref 3.5–5.0)
Alkaline Phosphatase: 56 U/L (ref 38–126)
Anion gap: 9 (ref 5–15)
BUN: 14 mg/dL (ref 8–23)
CO2: 23 mmol/L (ref 22–32)
Calcium: 9.1 mg/dL (ref 8.9–10.3)
Chloride: 101 mmol/L (ref 98–111)
Creatinine, Ser: 0.77 mg/dL (ref 0.61–1.24)
GFR, Estimated: >60 mL/min (ref >60)
Glucose, Bld: 135 mg/dL — ABNORMAL HIGH (ref 70–99)
Potassium: 3.9 mmol/L (ref 3.5–5.1)
Sodium: 133 mmol/L — ABNORMAL LOW (ref 135–145)
Total Bilirubin: 1 mg/dL (ref 0.3–1.2)
Total Protein: 8.5 g/dL — ABNORMAL HIGH (ref 6.5–8.1)

## 2022-09-18 LAB — TSH: TSH: 3.055 u[IU]/mL (ref 0.350–4.500)

## 2022-09-18 LAB — LIPID PANEL
Cholesterol: 122 mg/dL (ref 0–200)
HDL: 40 mg/dL — ABNORMAL LOW (ref >40)
LDL Cholesterol: 70 mg/dL (ref 0–99)
Total CHOL/HDL Ratio: 3.1 RATIO
Triglycerides: 60 mg/dL (ref <150)
VLDL: 12 mg/dL (ref 0–40)

## 2022-09-18 LAB — PSA: Prostatic Specific Antigen: 1.04 ng/mL (ref 0.00–4.00)

## 2022-09-18 LAB — VITAMIN B12: Vitamin B-12: 510 pg/mL (ref 180–914)

## 2022-09-18 LAB — VITAMIN D 25 HYDROXY (VIT D DEFICIENCY, FRACTURES): Vit D, 25-Hydroxy: 37.47 ng/mL (ref 30–100)

## 2022-11-21 ENCOUNTER — Other Ambulatory Visit (HOSPITAL_COMMUNITY): Payer: Self-pay

## 2022-11-22 ENCOUNTER — Other Ambulatory Visit (HOSPITAL_COMMUNITY): Payer: Self-pay

## 2022-11-24 ENCOUNTER — Other Ambulatory Visit (HOSPITAL_COMMUNITY): Payer: Self-pay

## 2022-11-25 ENCOUNTER — Other Ambulatory Visit (HOSPITAL_COMMUNITY): Payer: Self-pay

## 2022-11-26 ENCOUNTER — Other Ambulatory Visit: Payer: Self-pay

## 2022-11-28 ENCOUNTER — Other Ambulatory Visit (HOSPITAL_COMMUNITY): Payer: Self-pay

## 2022-11-28 MED ORDER — TADALAFIL 5 MG PO TABS
5.0000 mg | ORAL_TABLET | Freq: Every day | ORAL | 0 refills | Status: DC
  Filled 2022-11-28: qty 90, 90d supply, fill #0

## 2022-12-01 ENCOUNTER — Other Ambulatory Visit: Payer: Self-pay

## 2022-12-17 ENCOUNTER — Other Ambulatory Visit (HOSPITAL_COMMUNITY): Payer: Self-pay

## 2022-12-17 MED ORDER — METHYLPREDNISOLONE 4 MG PO TBPK
ORAL_TABLET | ORAL | 0 refills | Status: DC
  Filled 2022-12-17 – 2023-02-15 (×2): qty 21, 6d supply, fill #0

## 2023-02-15 ENCOUNTER — Other Ambulatory Visit (HOSPITAL_COMMUNITY): Payer: Self-pay

## 2023-02-16 ENCOUNTER — Other Ambulatory Visit: Payer: Self-pay

## 2023-02-17 ENCOUNTER — Other Ambulatory Visit: Payer: Self-pay

## 2023-02-17 ENCOUNTER — Other Ambulatory Visit (HOSPITAL_COMMUNITY): Payer: Self-pay

## 2023-02-17 MED ORDER — JARDIANCE 10 MG PO TABS
10.0000 mg | ORAL_TABLET | Freq: Every day | ORAL | 0 refills | Status: DC
  Filled 2023-02-17: qty 90, 90d supply, fill #0

## 2023-02-26 ENCOUNTER — Encounter (INDEPENDENT_AMBULATORY_CARE_PROVIDER_SITE_OTHER): Payer: Self-pay | Admitting: *Deleted

## 2023-04-14 ENCOUNTER — Other Ambulatory Visit (HOSPITAL_COMMUNITY): Payer: Self-pay

## 2023-04-16 ENCOUNTER — Other Ambulatory Visit (HOSPITAL_COMMUNITY): Payer: Self-pay

## 2023-04-16 MED ORDER — BENAZEPRIL HCL 40 MG PO TABS
20.0000 mg | ORAL_TABLET | Freq: Every day | ORAL | 0 refills | Status: DC
  Filled 2023-04-26: qty 50, 100d supply, fill #0
  Filled 2023-05-11: qty 45, 90d supply, fill #0

## 2023-04-16 MED ORDER — JARDIANCE 10 MG PO TABS
10.0000 mg | ORAL_TABLET | Freq: Every day | ORAL | 0 refills | Status: DC
  Filled 2023-04-26: qty 100, 100d supply, fill #0
  Filled 2023-05-11: qty 90, 90d supply, fill #0
  Filled 2024-01-30: qty 90, 90d supply, fill #1

## 2023-04-27 ENCOUNTER — Other Ambulatory Visit: Payer: Self-pay

## 2023-05-11 ENCOUNTER — Other Ambulatory Visit: Payer: Self-pay

## 2023-05-11 ENCOUNTER — Other Ambulatory Visit (HOSPITAL_COMMUNITY): Payer: Self-pay

## 2023-05-25 ENCOUNTER — Ambulatory Visit: Payer: Commercial Managed Care - PPO | Attending: Nurse Practitioner | Admitting: Nurse Practitioner

## 2023-05-25 ENCOUNTER — Other Ambulatory Visit (HOSPITAL_COMMUNITY): Payer: Self-pay

## 2023-05-25 VITALS — BP 126/86 | HR 93 | Ht 70.0 in | Wt 254.2 lb

## 2023-05-25 DIAGNOSIS — I200 Unstable angina: Secondary | ICD-10-CM | POA: Diagnosis not present

## 2023-05-25 DIAGNOSIS — R002 Palpitations: Secondary | ICD-10-CM

## 2023-05-25 DIAGNOSIS — E669 Obesity, unspecified: Secondary | ICD-10-CM | POA: Diagnosis not present

## 2023-05-25 DIAGNOSIS — I2510 Atherosclerotic heart disease of native coronary artery without angina pectoris: Secondary | ICD-10-CM

## 2023-05-25 DIAGNOSIS — I10 Essential (primary) hypertension: Secondary | ICD-10-CM | POA: Diagnosis not present

## 2023-05-25 MED ORDER — NITROGLYCERIN 0.4 MG SL SUBL
0.4000 mg | SUBLINGUAL_TABLET | SUBLINGUAL | 1 refills | Status: DC | PRN
  Filled 2023-05-25: qty 25, 5d supply, fill #0
  Filled 2023-05-25: qty 25, 7d supply, fill #0

## 2023-05-25 NOTE — Progress Notes (Signed)
Cardiology Office Note:  .   Date:  05/25/2023 ID:  Thyra Breed, DOB [birth year], MRN [id number] PCP: Toma Deiters, MD  [facility] Providers Cardiologist:  Nona Dell, MD    History of Present Illness: .   ARSHAUN ROSENGRANT is a 62 y.o. male with a PMH of CAD, hypertension, atypical chest pain, osteoarthritis, GERD, and depression, who presents today for scheduled follow-up visit.  Last seen by Dr. Diona Browner on August 16, 2020.  Had been experiencing intermittent upper chest discomfort for the last few months, sometimes noted in his arms.  This was noticed typically in the evenings after he had done fairly physical labor, not typically exertional.  Crestor 5 mg once weekly was added to medication regimen.  Was unable to tolerate Imdur in the past.  Lexi scan was arranged and was normal.  Today he presents for scheduled follow-up.  He notes palpitations at times, wants to know his risk of a cardiac event since his last stent, denies any chest pain. His wife says he is no longer taking his cholesterol medication and no longer on aspirin, pt decided to self-discontinue these medications. Confirms with me that he has had a past intolerance to Imdur and Metoprolol. Metoprolol caused his HR to drop in 40's and was symptomatic. Denies any chest pain, shortness of breath, syncope, presyncope, dizziness, orthopnea, PND, swelling or significant weight changes, acute bleeding, or claudication.  ROS: Negative. See HPI.  SH: Retired Emergency planning/management officer. FH: Father had hx of CABG x 4 at age 58, mother HTN.   Studies Reviewed: [name]    EKG:  EKG Interpretation Date/Time:  Monday May 25 2023 14:09:14 EST Ventricular Rate:  89 PR Interval:  156 QRS Duration:  110 QT Interval:  366 QTC Calculation: 445 R Axis:   5  Text Interpretation: Normal sinus rhythm Inferior infarct (cited on or before 22-Aug-2016) When compared with ECG of 22-Aug-2016 11:50, No significant change was found  Confirmed by Sharlene Dory [phone]) on 05/25/2023 2:13:45 PM   Lexiscan 08/2020:  Blood pressure demonstrated a hypertensive response to exercise. There was no ST segment deviation noted during stress. The study is normal. This is a low risk study. The left ventricular ejection fraction is normal (55-65%).   Normal resting and stress perfusion. No ischemia or infarction EF 54%  LHC 07/2016:  Conclusions: 90% stenosis involving dominant but small septal branch, which may account for ischemia noted on recent stress test. Minimal, non-obstructive coronary artery disease involving the distal LAD. Upper normal left ventricular filling pressure. Hyperdynamic left ventricular contraction (LVEF >65%).   Recommendations: Medical therapy; will initiate isosorbide mononitrate 30 mg daily. I would be very hesitant to consider PCI to septal branch unless patient has refractory pain despite exhaustion of all medical treatment options. Follow-up with Dr. Purvis Sheffield to evaluate response to isosorbide mononitrate and discuss further medical therapy/risk factor modification.  Physical Exam:   VS:  BP 126/86 (BP Location: Right Arm, Cuff Size: Normal)   Pulse 93   Ht 5\' 10"  (1.778 m)   Wt 254 lb 3.2 oz (115.3 kg)   SpO2 98%   BMI 36.47 kg/m    Wt Readings from Last 3 Encounters:  05/25/23 254 lb 3.2 oz (115.3 kg)  08/16/20 265 lb (120.2 kg)  01/05/19 260 lb (117.9 kg)    GEN:  Obese, 62 y.o. male in no acute distress NECK: No JVD; No carotid bruits CARDIAC: S1/S2, RRR, no murmurs, rubs, gallops RESPIRATORY:  Clear to auscultation  without rales, wheezing or rhonchi  ABDOMEN: Soft, non-tender, non-distended EXTREMITIES:  No edema; No deformity   ASSESSMENT AND PLAN: .    CAD Denies any chest pain. Lexiscan in 2022 was normal. LHC report from 2018 noted above. Will restart Aspirin 81 mg daily. Continue rest of medication regimen. LDL 70 08/2022, will focus on lifestyle modifications at this  time, consider restarting cholesterol medication at future follow-up visit per discussion with patient today. Pt expressed hesitancy in restarting cholesterol medication. Heart healthy diet and regular cardiovascular exercise encouraged. Care and ED precautions discussed. Will refill NTG.   2. HTN BP stable. Discussed to monitor BP at home at least 2 hours after medications and sitting for 5-10 minutes. No medication changes at this time. Heart healthy diet and regular cardiovascular exercise encouraged.   3. Palpitations Rare palpitations at time, sounds benign. No indication for monitor at this time. Heart healthy diet and regular cardiovascular exercise encouraged. No medication changes at this time.   4. Obesity Weight loss via diet and exercise encouraged. Discussed the impact being overweight would have on cardiovascular risk.  Dispo: Follow-up with Dr. Diona Browner or APP in 4-6 months or sooner if anything changes.   Signed, Sharlene Dory, NP

## 2023-05-25 NOTE — Patient Instructions (Addendum)
Medication Instructions:  Your physician has recommended you make the following change in your medication:  Please restart aspirin   Labwork: None   Testing/Procedures: None   Follow-Up: Your physician recommends that you schedule a follow-up appointment in: 4-6 months   Any Other Special Instructions Will Be Listed Below (If Applicable).     Mediterranean Diet  Why follow it? Research shows. Those who follow the Mediterranean diet have a reduced risk of heart disease  The diet is associated with a reduced incidence of Parkinson's and Alzheimer's diseases People following the diet may have longer life expectancies and lower rates of chronic diseases  The Dietary Guidelines for Americans recommends the Mediterranean diet as an eating plan to promote health and prevent disease  What Is the Mediterranean Diet?  Healthy eating plan based on typical foods and recipes of Mediterranean-style cooking The diet is primarily a plant based diet; these foods should make up a majority of meals   Starches - Plant based foods should make up a majority of meals - They are an important sources of vitamins, minerals, energy, antioxidants, and fiber - Choose whole grains, foods high in fiber and minimally processed items  - Typical grain sources include wheat, oats, barley, corn, brown rice, bulgar, farro, millet, polenta, couscous  - Various types of beans include chickpeas, lentils, fava beans, black beans, white beans   Fruits  Veggies - Large quantities of antioxidant rich fruits & veggies; 6 or more servings  - Vegetables can be eaten raw or lightly drizzled with oil and cooked  - Vegetables common to the traditional Mediterranean Diet include: artichokes, arugula, beets, broccoli, brussel sprouts, cabbage, carrots, celery, collard greens, cucumbers, eggplant, kale, leeks, lemons, lettuce, mushrooms, okra, onions, peas, peppers, potatoes, pumpkin, radishes, rutabaga, shallots, spinach, sweet  potatoes, turnips, zucchini - Fruits common to the Mediterranean Diet include: apples, apricots, avocados, cherries, clementines, dates, figs, grapefruits, grapes, melons, nectarines, oranges, peaches, pears, pomegranates, strawberries, tangerines  Fats - Replace butter and margarine with healthy oils, such as olive oil, canola oil, and tahini  - Limit nuts to no more than a handful a day  - Nuts include walnuts, almonds, pecans, pistachios, pine nuts  - Limit or avoid candied, honey roasted or heavily salted nuts - Olives are central to the [employer] - can be eaten whole or used in a variety of dishes   Meats Protein - Limiting red meat: no more than a few times a month - When eating red meat: choose lean cuts and keep the portion to the size of deck of cards - Eggs: approx. 0 to 4 times a week  - Fish and lean poultry: at least 2 a week  - Healthy protein sources include, chicken, [place], lean beef, lamb - Increase intake of seafood such as tuna, salmon, trout, mackerel, shrimp, scallops - Avoid or limit high fat processed meats such as sausage and bacon  Dairy - Include moderate amounts of low fat dairy products  - Focus on healthy dairy such as fat free yogurt, skim milk, low or reduced fat cheese - Limit dairy products higher in fat such as whole or 2% milk, cheese, ice cream  Alcohol - Moderate amounts of red wine is ok  - No more than 5 oz daily for women (all ages) and men older than age 34  - No more than 10 oz of wine daily for men younger than 68  Other - Limit sweets and other desserts  - Use herbs and  spices instead of salt to flavor foods  - Herbs and spices common to the traditional Mediterranean Diet include: basil, bay leaves, chives, cloves, cumin, fennel, garlic, lavender, marjoram, mint, oregano, parsley, pepper, rosemary, sage, savory, sumac, tarragon, thyme   It's not just a diet, it's a lifestyle:  The Mediterranean diet includes lifestyle factors typical of  those in the region  Foods, drinks and meals are best eaten with others and savored Daily physical activity is important for overall good health This could be strenuous exercise like running and aerobics This could also be more leisurely activities such as walking, housework, yard-work, or taking the stairs Moderation is the key; a balanced and healthy diet accommodates most foods and drinks Consider portion sizes and frequency of consumption of certain foods   Meal Ideas & Options:  Breakfast:  Whole wheat toast or whole wheat English muffins with peanut butter & hard boiled egg Steel cut oats topped with apples & cinnamon and skim milk  Fresh fruit: banana, strawberries, melon, berries, peaches  Smoothies: strawberries, bananas, greek yogurt, peanut butter Low fat greek yogurt with blueberries and granola  Egg white omelet with spinach and mushrooms Breakfast couscous: whole wheat couscous, apricots, skim milk, cranberries  Sandwiches:  Hummus and grilled vegetables (peppers, zucchini, squash) on whole wheat bread   Grilled chicken on whole wheat pita with lettuce, tomatoes, cucumbers or tzatziki  [place] salad on whole wheat bread: tuna salad made with greek yogurt, olives, red peppers, capers, green onions Garlic rosemary lamb pita: lamb sauted with garlic, rosemary, salt & pepper; add lettuce, cucumber, greek yogurt to pita - flavor with lemon juice and black pepper  Seafood:  Mediterranean grilled salmon, seasoned with garlic, basil, parsley, lemon juice and black pepper Shrimp, lemon, and spinach whole-grain pasta salad made with low fat greek yogurt  Seared scallops with lemon orzo  Seared tuna steaks seasoned salt, pepper, coriander topped with tomato mixture of olives, tomatoes, olive oil, minced garlic, parsley, green onions and cappers  Meats:  Herbed greek chicken salad with kalamata olives, cucumber, feta  Red bell peppers stuffed with spinach, bulgur, lean ground beef (or  lentils) & topped with feta   Kebabs: skewers of chicken, tomatoes, onions, zucchini, squash  [place] burgers: made with red onions, mint, dill, lemon juice, feta cheese topped with roasted red peppers Vegetarian Cucumber salad: cucumbers, artichoke hearts, celery, red onion, feta cheese, tossed in olive oil & lemon juice  Hummus and whole grain pita points with a greek salad (lettuce, tomato, feta, olives, cucumbers, red onion) Lentil soup with celery, carrots made with vegetable broth, garlic, salt and pepper  Tabouli salad: parsley, bulgur, mint, scallions, cucumbers, tomato, radishes, lemon juice, olive oil, salt and pepper. If you need a refill on your cardiac medications before your next appointment, please call your pharmacy.

## 2023-06-05 ENCOUNTER — Other Ambulatory Visit (HOSPITAL_COMMUNITY): Payer: Self-pay

## 2023-06-05 DIAGNOSIS — H524 Presbyopia: Secondary | ICD-10-CM | POA: Diagnosis not present

## 2023-06-05 DIAGNOSIS — H40013 Open angle with borderline findings, low risk, bilateral: Secondary | ICD-10-CM | POA: Diagnosis not present

## 2023-06-05 MED ORDER — TYRVAYA 0.03 MG/ACT NA SOLN
1.0000 | Freq: Two times a day (BID) | NASAL | 4 refills | Status: DC
  Filled 2023-06-05: qty 8.4, 30d supply, fill #0
  Filled 2023-06-29: qty 25.2, 90d supply, fill #0

## 2023-06-25 ENCOUNTER — Other Ambulatory Visit: Payer: Self-pay

## 2023-06-25 ENCOUNTER — Encounter (HOSPITAL_COMMUNITY): Payer: Self-pay | Admitting: Pharmacist

## 2023-06-25 ENCOUNTER — Other Ambulatory Visit (HOSPITAL_COMMUNITY): Payer: Self-pay

## 2023-06-25 MED ORDER — TYRVAYA 0.03 MG/ACT NA SOLN
1.0000 | Freq: Two times a day (BID) | NASAL | 3 refills | Status: DC
  Filled 2023-06-25: qty 25.2, 90d supply, fill #0
  Filled 2023-10-21: qty 25.2, 84d supply, fill #0

## 2023-06-26 ENCOUNTER — Other Ambulatory Visit: Payer: Self-pay

## 2023-06-29 ENCOUNTER — Other Ambulatory Visit (HOSPITAL_COMMUNITY): Payer: Self-pay

## 2023-06-29 ENCOUNTER — Other Ambulatory Visit: Payer: Self-pay

## 2023-06-30 ENCOUNTER — Other Ambulatory Visit: Payer: Self-pay

## 2023-07-06 ENCOUNTER — Other Ambulatory Visit (HOSPITAL_COMMUNITY): Payer: Self-pay

## 2023-07-06 MED ORDER — JARDIANCE 10 MG PO TABS
10.0000 mg | ORAL_TABLET | Freq: Every day | ORAL | 1 refills | Status: DC
  Filled 2023-07-30: qty 90, 90d supply, fill #0

## 2023-07-06 MED ORDER — BENAZEPRIL HCL 40 MG PO TABS
20.0000 mg | ORAL_TABLET | Freq: Every day | ORAL | 1 refills | Status: DC
  Filled 2023-07-30: qty 45, 90d supply, fill #0
  Filled 2024-01-30: qty 45, 90d supply, fill #1

## 2023-07-06 MED ORDER — TADALAFIL 5 MG PO TABS
5.0000 mg | ORAL_TABLET | Freq: Every day | ORAL | 2 refills | Status: DC
  Filled 2023-07-06: qty 90, 90d supply, fill #0

## 2023-07-07 ENCOUNTER — Other Ambulatory Visit: Payer: Self-pay

## 2023-07-13 DIAGNOSIS — H16223 Keratoconjunctivitis sicca, not specified as Sjogren's, bilateral: Secondary | ICD-10-CM | POA: Diagnosis not present

## 2023-07-30 ENCOUNTER — Other Ambulatory Visit (HOSPITAL_COMMUNITY): Payer: Self-pay

## 2023-07-30 ENCOUNTER — Other Ambulatory Visit: Payer: Self-pay

## 2023-08-21 ENCOUNTER — Other Ambulatory Visit (HOSPITAL_COMMUNITY): Payer: Self-pay

## 2023-08-24 ENCOUNTER — Other Ambulatory Visit (HOSPITAL_COMMUNITY): Payer: Self-pay

## 2023-08-24 ENCOUNTER — Other Ambulatory Visit: Payer: Self-pay

## 2023-08-24 MED ORDER — VALACYCLOVIR HCL 1 G PO TABS
2000.0000 mg | ORAL_TABLET | Freq: Two times a day (BID) | ORAL | 3 refills | Status: DC
  Filled 2023-08-24: qty 8, 2d supply, fill #0
  Filled 2023-10-21: qty 8, 2d supply, fill #1

## 2023-08-31 DIAGNOSIS — J101 Influenza due to other identified influenza virus with other respiratory manifestations: Secondary | ICD-10-CM | POA: Diagnosis not present

## 2023-08-31 DIAGNOSIS — Z20822 Contact with and (suspected) exposure to covid-19: Secondary | ICD-10-CM | POA: Diagnosis not present

## 2023-08-31 DIAGNOSIS — R059 Cough, unspecified: Secondary | ICD-10-CM | POA: Diagnosis not present

## 2023-10-08 ENCOUNTER — Other Ambulatory Visit (HOSPITAL_COMMUNITY)
Admission: RE | Admit: 2023-10-08 | Discharge: 2023-10-08 | Disposition: A | Discharge status: Home / Self Care | Source: Ambulatory Visit | Attending: Internal Medicine | Admitting: Internal Medicine

## 2023-10-08 DIAGNOSIS — R5383 Other fatigue: Secondary | ICD-10-CM | POA: Insufficient documentation

## 2023-10-08 DIAGNOSIS — E119 Type 2 diabetes mellitus without complications: Secondary | ICD-10-CM | POA: Insufficient documentation

## 2023-10-08 DIAGNOSIS — E782 Mixed hyperlipidemia: Secondary | ICD-10-CM | POA: Diagnosis not present

## 2023-10-08 DIAGNOSIS — I10 Essential (primary) hypertension: Secondary | ICD-10-CM | POA: Diagnosis not present

## 2023-10-08 LAB — BASIC METABOLIC PANEL WITH GFR
Anion gap: 9 (ref 5–15)
BUN: 14 mg/dL (ref 8–23)
CO2: 22 mmol/L (ref 22–32)
Calcium: 9.4 mg/dL (ref 8.9–10.3)
Chloride: 102 mmol/L (ref 98–111)
Creatinine, Ser: 0.91 mg/dL (ref 0.61–1.24)
GFR, Estimated: >60 mL/min (ref >60)
Glucose, Bld: 156 mg/dL — ABNORMAL HIGH (ref 70–99)
Potassium: 4.5 mmol/L (ref 3.5–5.1)
Sodium: 133 mmol/L — ABNORMAL LOW (ref 135–145)

## 2023-10-08 LAB — LIPID PANEL
Cholesterol: 123 mg/dL (ref 0–200)
HDL: 42 mg/dL (ref >40)
LDL Cholesterol: 69 mg/dL (ref 0–99)
Total CHOL/HDL Ratio: 2.9 ratio
Triglycerides: 61 mg/dL (ref <150)
VLDL: 12 mg/dL (ref 0–40)

## 2023-10-08 LAB — CBC
HCT: 51.6 % (ref 39.0–52.0)
Hemoglobin: 17.8 g/dL — ABNORMAL HIGH (ref 13.0–17.0)
MCH: 30.6 pg (ref 26.0–34.0)
MCHC: 34.5 g/dL (ref 30.0–36.0)
MCV: 88.8 fL (ref 80.0–100.0)
Platelets: 221 10*3/uL (ref 150–400)
RBC: 5.81 MIL/uL (ref 4.22–5.81)
RDW: 12.8 % (ref 11.5–15.5)
WBC: 9.4 10*3/uL (ref 4.0–10.5)
nRBC: 0 % (ref 0.0–0.2)

## 2023-10-08 LAB — HEMOGLOBIN A1C
Hgb A1c MFr Bld: 6.9 % — ABNORMAL HIGH (ref 4.8–5.6)
Mean Plasma Glucose: 151.33 mg/dL

## 2023-10-22 ENCOUNTER — Encounter (HOSPITAL_COMMUNITY): Payer: Self-pay

## 2023-10-22 ENCOUNTER — Other Ambulatory Visit: Payer: Self-pay

## 2023-10-22 ENCOUNTER — Other Ambulatory Visit (HOSPITAL_COMMUNITY): Payer: Self-pay

## 2023-10-23 ENCOUNTER — Other Ambulatory Visit: Payer: Self-pay

## 2023-10-28 ENCOUNTER — Other Ambulatory Visit (HOSPITAL_BASED_OUTPATIENT_CLINIC_OR_DEPARTMENT_OTHER): Payer: Self-pay

## 2023-10-28 ENCOUNTER — Other Ambulatory Visit: Payer: Self-pay

## 2023-11-02 ENCOUNTER — Other Ambulatory Visit: Payer: Self-pay

## 2023-11-02 ENCOUNTER — Other Ambulatory Visit (HOSPITAL_COMMUNITY): Payer: Self-pay

## 2023-11-02 MED ORDER — JARDIANCE 10 MG PO TABS
10.0000 mg | ORAL_TABLET | Freq: Every day | ORAL | 0 refills | Status: DC
  Filled 2023-11-02: qty 90, 90d supply, fill #0

## 2023-11-02 MED ORDER — TADALAFIL 5 MG PO TABS
5.0000 mg | ORAL_TABLET | Freq: Every day | ORAL | 0 refills | Status: DC
  Filled 2023-11-02: qty 90, 90d supply, fill #0

## 2023-11-02 MED ORDER — BENAZEPRIL HCL 40 MG PO TABS
20.0000 mg | ORAL_TABLET | Freq: Every day | ORAL | 0 refills | Status: DC
  Filled 2023-11-02: qty 45, 90d supply, fill #0

## 2023-11-02 MED ORDER — VALACYCLOVIR HCL 1 G PO TABS
2000.0000 mg | ORAL_TABLET | Freq: Two times a day (BID) | ORAL | 4 refills | Status: DC
  Filled 2023-11-02: qty 8, 2d supply, fill #0

## 2023-11-03 ENCOUNTER — Other Ambulatory Visit (HOSPITAL_COMMUNITY): Payer: Self-pay

## 2023-11-03 ENCOUNTER — Other Ambulatory Visit: Payer: Self-pay

## 2023-11-11 ENCOUNTER — Ambulatory Visit: Payer: Commercial Managed Care - PPO | Admitting: Cardiology

## 2023-11-17 ENCOUNTER — Encounter (INDEPENDENT_AMBULATORY_CARE_PROVIDER_SITE_OTHER): Payer: Self-pay | Admitting: *Deleted

## 2023-11-19 ENCOUNTER — Other Ambulatory Visit (HOSPITAL_COMMUNITY)
Admission: RE | Admit: 2023-11-19 | Discharge: 2023-11-19 | Disposition: A | Discharge status: Home / Self Care | Source: Ambulatory Visit | Attending: Internal Medicine | Admitting: Internal Medicine

## 2023-11-19 DIAGNOSIS — I10 Essential (primary) hypertension: Secondary | ICD-10-CM | POA: Insufficient documentation

## 2023-11-19 DIAGNOSIS — E119 Type 2 diabetes mellitus without complications: Secondary | ICD-10-CM | POA: Diagnosis not present

## 2023-11-19 DIAGNOSIS — R5383 Other fatigue: Secondary | ICD-10-CM | POA: Insufficient documentation

## 2023-11-19 DIAGNOSIS — E782 Mixed hyperlipidemia: Secondary | ICD-10-CM | POA: Insufficient documentation

## 2023-11-19 LAB — COMPREHENSIVE METABOLIC PANEL WITH GFR
ALT: 41 U/L (ref 0–44)
AST: 30 U/L (ref 15–41)
Albumin: 3.8 g/dL (ref 3.5–5.0)
Alkaline Phosphatase: 56 U/L (ref 38–126)
Anion gap: 11 (ref 5–15)
BUN: 13 mg/dL (ref 8–23)
CO2: 22 mmol/L (ref 22–32)
Calcium: 9.2 mg/dL (ref 8.9–10.3)
Chloride: 102 mmol/L (ref 98–111)
Creatinine, Ser: 0.92 mg/dL (ref 0.61–1.24)
GFR, Estimated: >60 mL/min (ref >60)
Glucose, Bld: 160 mg/dL — ABNORMAL HIGH (ref 70–99)
Potassium: 4.2 mmol/L (ref 3.5–5.1)
Sodium: 135 mmol/L (ref 135–145)
Total Bilirubin: 0.6 mg/dL (ref 0.0–1.2)
Total Protein: 8.1 g/dL (ref 6.5–8.1)

## 2023-11-19 LAB — PSA: Prostatic Specific Antigen: 1.19 ng/mL (ref 0.00–4.00)

## 2023-11-19 LAB — HEMOGLOBIN A1C
Hgb A1c MFr Bld: 7 % — ABNORMAL HIGH (ref 4.8–5.6)
Mean Plasma Glucose: 154.2 mg/dL

## 2023-11-19 LAB — LIPID PANEL
Cholesterol: 121 mg/dL (ref 0–200)
HDL: 42 mg/dL (ref >40)
LDL Cholesterol: 44 mg/dL (ref 0–99)
Total CHOL/HDL Ratio: 2.9 ratio
Triglycerides: 175 mg/dL — ABNORMAL HIGH (ref <150)
VLDL: 35 mg/dL (ref 0–40)

## 2023-12-15 ENCOUNTER — Telehealth: Payer: Self-pay

## 2023-12-15 NOTE — Telephone Encounter (Signed)
 Who is your primary care physician: DR.Xaje Hasanaj  Reasons for the colonoscopy: screening  Have you had a colonoscopy before?  yes  Do you have family history of colon cancer? no  Previous colonoscopy with polyps removed? no  Do you have a history colorectal cancer?   no  Are you diabetic? If yes, Type 1 or Type 2?    Yes type 2  Do you have a prosthetic or mechanical heart valve? no  Do you have a pacemaker/defibrillator?   no  Have you had endocarditis/atrial fibrillation? no  Have you had joint replacement within the last 12 months?  no  Do you tend to be constipated or have to use laxatives? no  Do you have any history of drugs or alchohol?  no  Do you use supplemental oxygen?  no  Have you had a stroke or heart attack within the last 6 months? no  Do you take weight loss medication?  no     Do you take any blood-thinning medications such as: (aspirin , warfarin, Plavix, Aggrenox)  yes   If yes we need the name, milligram, dosage and who is prescribing doctor ASA [address] md DR.McDowell Current Outpatient Medications on File Prior to Visit  Medication Sig Dispense Refill   albuterol  (VENTOLIN  HFA) 108 (90 Base) MCG/ACT inhaler Inhale 2 puffs into the lungs every 6 (six) hours as needed for wheezing or shortness of breath. 8 g 0   aspirin  81 MG EC tablet Take by mouth. (Patient not taking: Reported on 05/25/2023)     aspirin  EC 81 MG tablet Take 81 mg by mouth every morning. (Patient not taking: Reported on 05/25/2023)     benazepril  (LOTENSIN ) 40 MG tablet Take 0.5 tablets (20 mg total) by mouth daily. 50 tablet 0   benazepril  (LOTENSIN ) 40 MG tablet Take 0.5 tablets (20 mg total) by mouth daily. 45 tablet 1   benazepril  (LOTENSIN ) 40 MG tablet Take 0.5 tablets (20 mg total) by mouth daily. 50 tablet 0   benzonatate  (TESSALON ) 100 MG capsule Take 1 capsule (100 mg total) by mouth every 8 (eight) hours. 21 capsule 0   cefUROXime  (CEFTIN ) 500 MG tablet Take 1 tablet (500 mg  total) by mouth 2 (two) times daily. (Patient not taking: Reported on 05/25/2023) 14 tablet 0   cetirizine  (ZYRTEC ) 10 MG tablet Take 1 tablet (10 mg total) by mouth daily. (Patient not taking: Reported on 05/25/2023) 30 tablet 0   diphenhydrAMINE  (BENADRYL ) 50 MG capsule Take 50 mg by mouth at bedtime as needed.     empagliflozin  (JARDIANCE ) 10 MG TABS tablet Take 1 tablet (10 mg total) by mouth daily. 100 tablet 0   empagliflozin  (JARDIANCE ) 10 MG TABS tablet take 1 tablet (10 mg total) by mouth daily. 90 tablet 1   empagliflozin  (JARDIANCE ) 10 MG TABS tablet take 1 tablet (10 mg total) by mouth daily. 100 tablet 0   famotidine  (PEPCID ) 20 MG tablet Take 20 mg by mouth daily as needed for indigestion.     fluticasone (FLONASE) 50 MCG/ACT nasal spray Place into the nose.     ipratropium (ATROVENT) 0.06 % nasal spray 2 sprays by Each Nare route Three (3) times a day. (Patient not taking: Reported on 05/25/2023)     meclizine (ANTIVERT) 25 MG tablet Take by mouth.     methylPREDNISolone  (MEDROL ) 4 MG TBPK tablet take as per package directions (Patient not taking: Reported on 05/25/2023) 21 tablet 0   naproxen sodium (ANAPROX) 220 MG tablet Take  110 mg by mouth every morning.      nitroGLYCERIN  (NITROSTAT ) 0.4 MG SL tablet Place 1 tablet (0.4 mg total) under the tongue every 5 (five) minutes as needed for chest pain. 25 tablet 1   Promethazine -Codeine  6.25-10 MG/5ML SOLN Take 5 mLs by mouth every 4-6 hours as needed for cough 90 mL 1   promethazine -dextromethorphan (PROMETHAZINE -DM) 6.25-15 MG/5ML syrup Take 5 mLs by mouth every 6 (six) hours as needed for cough (Patient not taking: Reported on 05/25/2023) 90 mL 0   Suvorexant  (BELSOMRA ) 5 MG TABS Take 1 tablet (5 mg total) by mouth at bedtime. (Patient not taking: Reported on 05/25/2023) 90 tablet 0   Suvorexant  (BELSOMRA ) 5 MG TABS Take 1 tablet (5 mg total) by mouth at bedtime. (Patient not taking: Reported on 05/25/2023) 90 tablet 0   tadalafil   (CIALIS ) 5 MG tablet Take 1 tablet (5 mg total) by mouth daily. 90 tablet 0   tadalafil  (CIALIS ) 5 MG tablet take 1 tablet (5 mg total) by mouth daily. 100 tablet 2   tadalafil  (CIALIS ) 5 MG tablet take 1 tablet (5 mg total) by mouth daily. 100 tablet 0   TYRVAYA  0.03 MG/ACT SOLN Place 1 spray into both nostrils 2 (two) times daily. 8.4 mL 4   TYRVAYA  0.03 MG/ACT SOLN Spray 1 spray into both nostrils twice a day 25.2 mL 3   valACYclovir  (VALTREX ) 1000 MG tablet Take 2 tablets (2,000 mg total) by mouth every 12 (twelve) hours. 8 tablet 3   valACYclovir  (VALTREX ) 1000 MG tablet Take 2 tablets (2,000 mg total) by mouth every 12 (twelve) hours. 8 tablet 4   No current facility-administered medications on file prior to visit.    No Known Allergies   Pharmacy: [facility]  Primary Insurance Name: Hulan Pack [id number]  Best number where you can be reached: [phone]

## 2023-12-21 NOTE — Telephone Encounter (Signed)
Ok to schedule.  Room : any   Thanks,  Vista Lawman, MD Gastroenterology and Hepatology [facility]

## 2023-12-21 NOTE — Telephone Encounter (Signed)
 LMOVM to return call.

## 2023-12-29 NOTE — Telephone Encounter (Signed)
 Pt will need to be scheduled in September. He will out of town the 1st week of September. He wants suprep instead to the jug. Advised pt's wife Camelia will call once we get providers schedule for September.

## 2024-01-07 NOTE — Telephone Encounter (Signed)
 LMOVM to return call.

## 2024-01-11 DIAGNOSIS — H40011 Open angle with borderline findings, low risk, right eye: Secondary | ICD-10-CM | POA: Diagnosis not present

## 2024-02-01 ENCOUNTER — Other Ambulatory Visit (HOSPITAL_COMMUNITY): Payer: Self-pay

## 2024-02-01 ENCOUNTER — Other Ambulatory Visit: Payer: Self-pay

## 2024-02-01 MED ORDER — JARDIANCE 10 MG PO TABS
10.0000 mg | ORAL_TABLET | Freq: Every day | ORAL | 0 refills | Status: DC
  Filled 2024-02-01: qty 90, 90d supply, fill #0
  Filled 2024-04-28: qty 90, 90d supply, fill #1

## 2024-02-08 ENCOUNTER — Other Ambulatory Visit: Payer: Self-pay | Admitting: *Deleted

## 2024-02-08 ENCOUNTER — Encounter: Payer: Self-pay | Admitting: *Deleted

## 2024-02-08 ENCOUNTER — Encounter (INDEPENDENT_AMBULATORY_CARE_PROVIDER_SITE_OTHER): Payer: Self-pay | Admitting: *Deleted

## 2024-02-08 MED ORDER — NA SULFATE-K SULFATE-MG SULF 17.5-3.13-1.6 GM/177ML PO SOLN: ORAL | 0 refills | Status: AC

## 2024-02-08 NOTE — Telephone Encounter (Signed)
 Referral completed, [facility] apt letter sent to PCP

## 2024-02-08 NOTE — Telephone Encounter (Signed)
 Pt has been scheduled for 03/14/24. Instructions mailed and prep sent to pharmacy

## 2024-03-08 ENCOUNTER — Other Ambulatory Visit: Payer: Self-pay

## 2024-03-08 ENCOUNTER — Other Ambulatory Visit (HOSPITAL_COMMUNITY): Payer: Self-pay

## 2024-03-08 MED ORDER — BENAZEPRIL HCL 40 MG PO TABS
20.0000 mg | ORAL_TABLET | Freq: Every day | ORAL | 0 refills | Status: AC
  Filled 2024-04-28: qty 50, 100d supply, fill #0

## 2024-03-08 MED ORDER — ALFUZOSIN HCL ER 10 MG PO TB24
10.0000 mg | ORAL_TABLET | Freq: Every day | ORAL | 0 refills | Status: AC
  Filled 2024-03-08: qty 90, 90d supply, fill #0

## 2024-03-08 MED ORDER — VALACYCLOVIR HCL 1 G PO TABS
2.0000 g | ORAL_TABLET | Freq: Two times a day (BID) | ORAL | 4 refills | Status: AC
  Filled 2024-03-08: qty 8, 2d supply, fill #0
  Filled 2024-04-28: qty 8, 2d supply, fill #1

## 2024-03-08 MED ORDER — TADALAFIL 5 MG PO TABS
5.0000 mg | ORAL_TABLET | Freq: Every day | ORAL | 0 refills | Status: AC
  Filled 2024-03-08: qty 90, 90d supply, fill #0

## 2024-03-14 ENCOUNTER — Encounter (HOSPITAL_COMMUNITY)
Admission: RE | Disposition: A | Discharge status: Home / Self Care | Payer: Self-pay | Source: Home / Self Care | Attending: Gastroenterology

## 2024-03-14 ENCOUNTER — Ambulatory Visit (HOSPITAL_COMMUNITY)
Admission: RE | Admit: 2024-03-14 | Discharge: 2024-03-14 | Disposition: A | Discharge status: Home / Self Care | Attending: Gastroenterology | Admitting: Gastroenterology

## 2024-03-14 ENCOUNTER — Encounter (HOSPITAL_COMMUNITY): Payer: Self-pay | Admitting: Gastroenterology

## 2024-03-14 ENCOUNTER — Ambulatory Visit (HOSPITAL_BASED_OUTPATIENT_CLINIC_OR_DEPARTMENT_OTHER): Payer: Self-pay | Admitting: Anesthesiology

## 2024-03-14 ENCOUNTER — Other Ambulatory Visit: Payer: Self-pay

## 2024-03-14 ENCOUNTER — Ambulatory Visit (HOSPITAL_COMMUNITY): Payer: Self-pay | Admitting: Anesthesiology

## 2024-03-14 DIAGNOSIS — K6389 Other specified diseases of intestine: Secondary | ICD-10-CM | POA: Diagnosis not present

## 2024-03-14 DIAGNOSIS — K633 Ulcer of intestine: Secondary | ICD-10-CM

## 2024-03-14 DIAGNOSIS — Z87891 Personal history of nicotine dependence: Secondary | ICD-10-CM | POA: Diagnosis not present

## 2024-03-14 DIAGNOSIS — Z1211 Encounter for screening for malignant neoplasm of colon: Secondary | ICD-10-CM

## 2024-03-14 DIAGNOSIS — K56699 Other intestinal obstruction unspecified as to partial versus complete obstruction: Secondary | ICD-10-CM | POA: Diagnosis not present

## 2024-03-14 DIAGNOSIS — K529 Noninfective gastroenteritis and colitis, unspecified: Secondary | ICD-10-CM | POA: Insufficient documentation

## 2024-03-14 DIAGNOSIS — K5730 Diverticulosis of large intestine without perforation or abscess without bleeding: Secondary | ICD-10-CM | POA: Insufficient documentation

## 2024-03-14 DIAGNOSIS — I2510 Atherosclerotic heart disease of native coronary artery without angina pectoris: Secondary | ICD-10-CM

## 2024-03-14 DIAGNOSIS — I25119 Atherosclerotic heart disease of native coronary artery with unspecified angina pectoris: Secondary | ICD-10-CM | POA: Insufficient documentation

## 2024-03-14 DIAGNOSIS — I10 Essential (primary) hypertension: Secondary | ICD-10-CM | POA: Diagnosis not present

## 2024-03-14 DIAGNOSIS — K648 Other hemorrhoids: Secondary | ICD-10-CM | POA: Diagnosis not present

## 2024-03-14 DIAGNOSIS — E119 Type 2 diabetes mellitus without complications: Secondary | ICD-10-CM

## 2024-03-14 HISTORY — PX: COLONOSCOPY: SHX5424

## 2024-03-14 LAB — GLUCOSE, CAPILLARY: Glucose-Capillary: 115 mg/dL — ABNORMAL HIGH (ref 70–99)

## 2024-03-14 LAB — HM COLONOSCOPY

## 2024-03-14 SURGERY — COLONOSCOPY
Anesthesia: General

## 2024-03-14 MED ORDER — STERILE WATER FOR IRRIGATION IR SOLN
Status: DC | PRN
  Administered 2024-03-14: 120 mL

## 2024-03-14 MED ORDER — LACTATED RINGERS IV SOLN: INTRAVENOUS | Status: DC | PRN

## 2024-03-14 MED ORDER — PROPOFOL 500 MG/50ML IV EMUL
INTRAVENOUS | Status: DC | PRN
  Administered 2024-03-14: 125 ug/kg/min via INTRAVENOUS

## 2024-03-14 MED ORDER — LIDOCAINE 2% (20 MG/ML) 5 ML SYRINGE
INTRAMUSCULAR | Status: DC | PRN
  Administered 2024-03-14: 80 mg via INTRAVENOUS

## 2024-03-14 MED ORDER — SPOT INK MARKER SYRINGE KIT
PACK | SUBMUCOSAL | Status: DC | PRN
  Administered 2024-03-14: 1 mL via SUBMUCOSAL
  Administered 2024-03-14: 2.2 mL via SUBMUCOSAL

## 2024-03-14 MED ORDER — PROPOFOL 10 MG/ML IV BOLUS
INTRAVENOUS | Status: DC | PRN
  Administered 2024-03-14: 50 mg via INTRAVENOUS
  Administered 2024-03-14: 30 mg via INTRAVENOUS

## 2024-03-14 MED ORDER — LACTATED RINGERS IV SOLN: INTRAVENOUS | Status: DC

## 2024-03-14 NOTE — Anesthesia Preprocedure Evaluation (Signed)
 Anesthesia Evaluation  Patient identified by MRN, date of birth, ID band Patient awake    Reviewed: Allergy & Precautions, H&P , NPO status , Patient's Chart, lab work & pertinent test results, reviewed documented beta blocker date and time   Airway Mallampati: II  TM Distance: >3 FB Neck ROM: full    Dental no notable dental hx.    Pulmonary neg pulmonary ROS, former smoker   Pulmonary exam normal breath sounds clear to auscultation       Cardiovascular Exercise Tolerance: Good hypertension, + angina  + CAD   Rhythm:regular Rate:Normal     Neuro/Psych  PSYCHIATRIC DISORDERS  Depression    negative neurological ROS     GI/Hepatic Neg liver ROS,GERD  ,,  Endo/Other  diabetes    Renal/GU negative Renal ROS  negative genitourinary   Musculoskeletal   Abdominal   Peds  Hematology negative hematology ROS (+)   Anesthesia Other Findings   Reproductive/Obstetrics negative OB ROS                              Anesthesia Physical Anesthesia Plan  ASA: 3  Anesthesia Plan: General   Post-op Pain Management:    Induction:   PONV Risk Score and Plan: Propofol  infusion  Airway Management Planned:   Additional Equipment:   Intra-op Plan:   Post-operative Plan:   Informed Consent: I have reviewed the patients History and Physical, chart, labs and discussed the procedure including the risks, benefits and alternatives for the proposed anesthesia with the patient or authorized representative who has indicated his/her understanding and acceptance.     Dental Advisory Given  Plan Discussed with: CRNA  Anesthesia Plan Comments:         Anesthesia Quick Evaluation

## 2024-03-14 NOTE — Op Note (Signed)
 [facility] Patient Name: Blake Mcmillan Procedure Date: 03/14/2024 7:18 AM MRN: [id number] Date of Birth: [birth year] Attending MD: Deatrice Dine , MD, [id number] CSN: [id number] Age: 63 Admit Type: Outpatient Procedure:                Colonoscopy Indications:              Screening for colorectal malignant neoplasm Providers:                Deatrice Dine, MD, Leandrew Edelman RN, RN, Mickel CROME.                            Shirlean Balm, Technician Referring MD:              Medicines:                Monitored Anesthesia Care Complications:            No immediate complications. Estimated Blood Loss:     Estimated blood loss was minimal. Procedure:                Pre-Anesthesia Assessment:                           - Prior to the procedure, a History and Physical                            was performed, and patient medications and                            allergies were reviewed. The patient's tolerance of                            previous anesthesia was also reviewed. The risks                            and benefits of the procedure and the sedation                            options and risks were discussed with the patient.                            All questions were answered, and informed consent                            was obtained. Prior Anticoagulants: The patient has                            taken no anticoagulant or antiplatelet agents                            except for NSAID medication. ASA Grade Assessment:                            II - A patient with mild systemic disease. After  reviewing the risks and benefits, the patient was                            deemed in satisfactory condition to undergo the                            procedure.                           After obtaining informed consent, the colonoscope                            was passed under direct vision. Throughout the                            procedure,  the patient's blood pressure, pulse, and                            oxygen saturations were monitored continuously. The                            [id number] ([id number]) Colon was introduced through                            the anus and advanced to the the cecum, identified                            by appendiceal orifice and ileocecal valve. The                            ileocecal valve, appendiceal orifice, and rectum                            were photographed. Scope In: 7:57:39 AM Scope Out: 8:28:28 AM Scope Withdrawal Time: 0 hours 26 minutes 52 seconds  Total Procedure Duration: 0 hours 30 minutes 49 seconds  Findings:      The perianal and digital rectal examinations were normal.      Discontinuous areas of nonbleeding ulcerated mucosa with no stigmata of       recent bleeding were present in the entire colon. Biopsies were taken       with a cold forceps for histology.      A moderate stenosis measuring 2 cm (inner diameter) was found in the       proximal ascending colon and was traversed. Biopsies were taken with a       cold forceps for histology. Area was tattooed with an injection of 2 mL       of Spot (carbon black).      A localized area of friable mucosa with contact bleeding was found in       the proximal transverse colon. Biopsies were taken with a cold forceps       for histology. Area was tattooed with an injection of 1 mL of Spot       (carbon black).      A few diverticula were found in the left colon.      Non-bleeding internal  hemorrhoids were found during endoscopy. The       hemorrhoids were small. Retroflexion not performed due to narrow anal       canal Impression:               - Mucosal ulceration in the entire examined colon.                            Biopsied. r/o Malignancy vs NSAID induced colopathy                           - Stricture in the proximal ascending colon.                            Biopsied. Tattooed.                           -  Friability with contact bleeding in the proximal                            transverse colon. Biopsied. Tattooed.                           - Diverticulosis in the left colon.                           - Non-bleeding internal hemorrhoids. Moderate Sedation:      Per Anesthesia Care Recommendation:           - Patient has a contact number available for                            emergencies. The signs and symptoms of potential                            delayed complications were discussed with the                            patient. Return to normal activities tomorrow.                            Written discharge instructions were provided to the                            patient.                           - Resume previous diet.                           - Continue present medications.                           - Await pathology results.                           - Repeat colonoscopy in 6 months for surveillance  based on pathology results to assess healing .                           - Return to primary care physician as previously                            scheduled.                           -AVOID NSAIDS Procedure Code(s):        --- Professional ---                           [phone], Colonoscopy, flexible; with biopsy, single                            or multiple                           [id number], Colonoscopy, flexible; with directed                            submucosal injection(s), any substance Diagnosis Code(s):        --- Professional ---                           Z12.11, Encounter for screening for malignant                            neoplasm of colon                           K63.3, Ulcer of intestine                           K56.699, Other intestinal obstruction unspecified                            as to partial versus complete obstruction                           K92.2, Gastrointestinal hemorrhage, unspecified                            K64.8, Other hemorrhoids                           K57.30, Diverticulosis of large intestine without                            perforation or abscess without bleeding CPT copyright 2022 American Medical Association. All rights reserved. The codes documented in this report are preliminary and upon coder review may  be revised to meet current compliance requirements. Deatrice Dine, MD Deatrice Dine, MD 03/14/2024 8:41:39 AM This report has been signed electronically. Number of Addenda: 0

## 2024-03-14 NOTE — H&P (Signed)
 Primary Care Physician:  Orpha Yancey LABOR, MD Primary Gastroenterologist:  Dr. Cinderella  Pre-Procedure History & Physical: HPI:  Blake Mcmillan is a 63 y.o. male is here for a colonoscopy for colon cancer screening purposes.  Patient denies any family history of colorectal cancer.  No melena or hematochezia.  No abdominal pain or unintentional weight loss.  No change in bowel habits.  Overall feels well from a GI standpoint.  Past Medical History:  Diagnosis Date   CAD (coronary artery disease)    90% septal branch managed medically 2018   Depression    Essential hypertension    GERD (gastroesophageal reflux disease)    Hypertrophic synovitis    Left knee   Osteoarthritis    Seasonal allergies     Past Surgical History:  Procedure Laterality Date   CARPAL TUNNEL RELEASE  05-09-11   Bilaterally '02   HEMORRHOID SURGERY  05-09-11   '96   KNEE ARTHROSCOPY     RT KNEE   KNEE ARTHROSCOPY Left 02/01/2014   Procedure: LEFT KNEE ARTHROSCOPY WITH SYNOVECTOMY ;  Surgeon: Dempsey Melodi GAILS, MD;  Location: [facility];  Service: Orthopedics;  Laterality: Left;   LEFT HEART CATH AND CORONARY ANGIOGRAPHY N/A 08/22/2016   Procedure: Left Heart Cath and Coronary Angiography;  Surgeon: Lonni Hanson, MD;  Location: [facility] INVASIVE CV LAB;  Service: Cardiovascular;  Laterality: N/A;   NASAL SINUS SURGERY  05-09-11   2'12 [place] AASE  05-09-11   '97   TOTAL KNEE ARTHROPLASTY  05/12/2011   Procedure: TOTAL KNEE ARTHROPLASTY;  Surgeon: Dempsey GAILS Melodi;  Location: [facility];  Service: Orthopedics;  Laterality: Left;   VASECTOMY  05-09-11   '07    Prior to Admission medications   Medication Sig Start Date End Date Taking? Authorizing Provider  alfuzosin (UROXATRAL) 10 MG 24 hr tablet Take 1 tablet (10 mg total) by mouth daily after the same meal each day. 03/08/24  Yes   aspirin  81 MG EC tablet Take by mouth.   Yes [provider]  benazepril  (LOTENSIN ) 40 MG tablet Take 0.5 tablets (20  mg total) by mouth daily. 03/08/24  Yes   empagliflozin  (JARDIANCE ) 10 MG TABS tablet Take 1 tablet (10 mg total) by mouth daily. 02/01/24  Yes   Na Sulfate-K Sulfate-Mg Sulfate concentrate (SUPREP) 17.5-3.13-1.6 GM/177ML SOLN As directed 02/08/24  Yes Selicia Windom F, MD  naproxen sodium (ANAPROX) 220 MG tablet Take 110 mg by mouth every morning.    Yes [provider]  Amantadine HCl (GOCOVRI PO) Take 2 tablets by mouth 2 (two) times daily with a meal.    [provider]  tadalafil  (CIALIS ) 5 MG tablet Take 1 tablet (5 mg total) by mouth daily. 03/08/24     valACYclovir  (VALTREX ) 1000 MG tablet Take 2 tablets (2,000 mg total) by mouth 2 (two) times daily. 03/08/24       Allergies as of 02/08/2024   (No Known Allergies)    Family History  Problem Relation Age of Onset   Hypertension Mother    Heart disease Father    Heart attack Father    Heart disease Brother    Heart attack Brother    Heart attack Maternal Grandfather     Social History   Socioeconomic History   Marital status: Married    Spouse name: Not on file   Number of children: Not on file   Years of education: Not on file   Highest education level: Not on  file  Occupational History   Not on file  Tobacco Use   Smoking status: Former    Current packs/day: 0.00    Types: Cigarettes    Quit date: 06/08/1992    Years since quitting: 31.7   Smokeless tobacco: Never  Substance and Sexual Activity   Alcohol use: No   Drug use: No   Sexual activity: Not on file  Other Topics Concern   Not on file  Social History Narrative   Not on file   Social Drivers of Health   Financial Resource Strain: Not on file  Food Insecurity: Not on file  Transportation Needs: Not on file  Physical Activity: Not on file  Stress: Not on file  Social Connections: Not on file  Intimate Partner Violence: Not on file    Review of Systems: See HPI, otherwise negative ROS  Physical Exam: Vital signs in last 24  hours: Temp:  [98.2 F (36.8 C)] 98.2 F (36.8 C) (10/20 0659) Pulse Rate:  [76] 76 (10/20 0659) Resp:  [15] 15 (10/20 0659) BP: (136)/(81) 136/81 (10/20 0659) SpO2:  [94 %] 94 % (10/20 0659) Weight:  [111.1 kg] 111.1 kg (10/20 0646)   General:   Alert,  Well-developed, well-nourished, pleasant and cooperative in NAD Head:  Normocephalic and atraumatic. Eyes:  Sclera clear, no icterus.   Conjunctiva pink. Ears:  Normal auditory acuity. Nose:  No deformity, discharge,  or lesions. Msk:  Symmetrical without gross deformities. Normal posture. Extremities:  Without clubbing or edema. Neurologic:  Alert and  oriented x4;  grossly normal neurologically. Skin:  Intact without significant lesions or rashes. Psych:  Alert and cooperative. Normal mood and affect.  Impression/Plan: Blake Mcmillan is here for a colonoscopy to be performed for colon cancer screening purposes.  The risks of the procedure including infection, bleed, or perforation as well as benefits, limitations, alternatives and imponderables have been reviewed with the patient. Questions have been answered. All parties agreeable.

## 2024-03-14 NOTE — Transfer of Care (Signed)
 Immediate Anesthesia Transfer of Care Note  Patient: Blake Mcmillan  Procedure(s) Performed: COLONOSCOPY  Patient Location: PACU  Anesthesia Type:MAC  Level of Consciousness: awake and alert   Airway & Oxygen Therapy: Patient Spontanous Breathing and Patient connected to nasal cannula oxygen  Post-op Assessment: Report given to RN and Post -op Vital signs reviewed and stable  Post vital signs: Reviewed and stable  Last Vitals:  Vitals Value Taken Time  BP    Temp    Pulse    Resp    SpO2      Last Pain:  Vitals:   03/14/24 0752  TempSrc:   PainSc: 0-No pain      Patients Stated Pain Goal: 10 (03/14/24 0646)  Complications: No notable events documented.

## 2024-03-14 NOTE — Discharge Instructions (Addendum)
  Discharge instructions Please read the instructions outlined below and refer to this sheet in the next few weeks. These discharge instructions provide you with general information on caring for yourself after you leave the hospital. Your doctor may also give you specific instructions. While your treatment has been planned according to the most current medical practices available, unavoidable complications occasionally occur. If you have any problems or questions after discharge, please call your doctor. ACTIVITY You may resume your regular activity but move at a slower pace for the next 24 hours.  Take frequent rest periods for the next 24 hours.  Walking will help expel (get rid of) the air and reduce the bloated feeling in your abdomen.  No driving for 24 hours (because of the anesthesia (medicine) used during the test).  You may shower.  Do not sign any important legal documents or operate any machinery for 24 hours (because of the anesthesia used during the test).  NUTRITION Drink plenty of fluids.  You may resume your normal diet.  Begin with a light meal and progress to your normal diet.  Avoid alcoholic beverages for 24 hours or as instructed by your caregiver.  MEDICATIONS You may resume your normal medications unless your caregiver tells you otherwise.  WHAT YOU CAN EXPECT TODAY You may experience abdominal discomfort such as a feeling of fullness or "gas" pains.  FOLLOW-UP Your doctor will discuss the results of your test with you.  SEEK IMMEDIATE MEDICAL ATTENTION IF ANY OF THE FOLLOWING OCCUR: Excessive nausea (feeling sick to your stomach) and/or vomiting.  Severe abdominal pain and distention (swelling).  Trouble swallowing.  Temperature over 101 F (37.8 C).  Rectal bleeding or vomiting of blood.    Stop using high dose aspirin including Goody/BC powders, NSAIDs such as Aleve, ibuprofen, naproxen, Motrin, Voltaren or Advil (even the topical ones)    I hope you have a  great rest of your week!   Vista Lawman , M.D.. Gastroenterology and Hepatology [facility]

## 2024-03-15 ENCOUNTER — Encounter (INDEPENDENT_AMBULATORY_CARE_PROVIDER_SITE_OTHER): Payer: Self-pay | Admitting: *Deleted

## 2024-03-15 ENCOUNTER — Encounter (HOSPITAL_COMMUNITY): Payer: Self-pay | Admitting: Gastroenterology

## 2024-03-15 LAB — SURGICAL PATHOLOGY

## 2024-03-16 NOTE — Anesthesia Postprocedure Evaluation (Signed)
 Anesthesia Post Note  Patient: Blake Mcmillan  Procedure(s) Performed: COLONOSCOPY  Patient location during evaluation: Phase II Anesthesia Type: General Level of consciousness: awake Pain management: pain level controlled Vital Signs Assessment: post-procedure vital signs reviewed and stable Respiratory status: spontaneous breathing and respiratory function stable Cardiovascular status: blood pressure returned to baseline and stable Postop Assessment: no headache and no apparent nausea or vomiting Anesthetic complications: no Comments: Late entry   No notable events documented.   Last Vitals:  Vitals:   03/14/24 0659 03/14/24 0834  BP: 136/81 107/72  Pulse: 76 71  Resp: 15 15  Temp: 36.8 C 36.6 C  SpO2: 94% 93%    Last Pain:  Vitals:   03/14/24 0834  TempSrc: Oral  PainSc: 0-No pain                 Yvonna JINNY Bosworth

## 2024-03-21 ENCOUNTER — Ambulatory Visit (INDEPENDENT_AMBULATORY_CARE_PROVIDER_SITE_OTHER): Payer: Self-pay | Admitting: Gastroenterology

## 2024-03-21 NOTE — Progress Notes (Signed)
 8 mth TCS noted in recall Patient result letter mailed procedure note and pathology result faxed to PCP

## 2024-04-26 DIAGNOSIS — M722 Plantar fascial fibromatosis: Secondary | ICD-10-CM | POA: Diagnosis not present

## 2024-04-26 DIAGNOSIS — M79672 Pain in left foot: Secondary | ICD-10-CM | POA: Diagnosis not present

## 2024-04-28 ENCOUNTER — Other Ambulatory Visit: Payer: Self-pay

## 2024-04-29 ENCOUNTER — Other Ambulatory Visit (HOSPITAL_COMMUNITY): Payer: Self-pay

## 2024-04-29 ENCOUNTER — Other Ambulatory Visit: Payer: Self-pay

## 2024-04-29 MED ORDER — JARDIANCE 10 MG PO TABS
10.0000 mg | ORAL_TABLET | Freq: Every day | ORAL | 0 refills | Status: AC
  Filled 2024-04-29 (×2): qty 90, 90d supply, fill #0

## 2024-05-02 ENCOUNTER — Other Ambulatory Visit: Payer: Self-pay

## 2024-05-03 ENCOUNTER — Other Ambulatory Visit: Payer: Self-pay
